# Patient Record
Sex: Female | Born: 1955 | Race: White | Hispanic: No | Smoking: Never smoker
Health system: Southern US, Community
[De-identification: ages and names within clinical notes are randomized; demographics above are authoritative.]

## PROBLEM LIST (undated history)

## (undated) DIAGNOSIS — R7303 Prediabetes: Secondary | ICD-10-CM

## (undated) DIAGNOSIS — E785 Hyperlipidemia, unspecified: Secondary | ICD-10-CM

## (undated) DIAGNOSIS — K219 Gastro-esophageal reflux disease without esophagitis: Secondary | ICD-10-CM

## (undated) DIAGNOSIS — T7840XA Allergy, unspecified, initial encounter: Secondary | ICD-10-CM

## (undated) DIAGNOSIS — K829 Disease of gallbladder, unspecified: Secondary | ICD-10-CM

## (undated) HISTORY — DX: Hyperlipidemia, unspecified: E78.5

## (undated) HISTORY — DX: Gastro-esophageal reflux disease without esophagitis: K21.9

## (undated) HISTORY — PX: ROTATOR CUFF REPAIR: SHX139

## (undated) HISTORY — DX: Disease of gallbladder, unspecified: K82.9

## (undated) HISTORY — DX: Allergy, unspecified, initial encounter: T78.40XA

## (undated) HISTORY — PX: SHOULDER SURGERY: SHX246

## (undated) HISTORY — PX: REDUCTION MAMMAPLASTY: SUR839

## (undated) HISTORY — DX: Prediabetes: R73.03

---

## 1991-12-31 HISTORY — PX: CHOLECYSTECTOMY: SHX55

## 1999-01-03 ENCOUNTER — Other Ambulatory Visit: Admission: RE | Admit: 1999-01-03 | Discharge: 1999-01-03 | Payer: Self-pay | Admitting: Gynecology

## 2000-03-18 ENCOUNTER — Other Ambulatory Visit: Admission: RE | Admit: 2000-03-18 | Discharge: 2000-03-18 | Payer: Self-pay | Admitting: Obstetrics and Gynecology

## 2002-07-27 ENCOUNTER — Encounter (INDEPENDENT_AMBULATORY_CARE_PROVIDER_SITE_OTHER): Payer: Self-pay | Admitting: Specialist

## 2002-07-27 ENCOUNTER — Ambulatory Visit (HOSPITAL_BASED_OUTPATIENT_CLINIC_OR_DEPARTMENT_OTHER): Admission: RE | Admit: 2002-07-27 | Discharge: 2002-07-28 | Payer: Self-pay | Admitting: Plastic Surgery

## 2003-09-13 ENCOUNTER — Other Ambulatory Visit: Admission: RE | Admit: 2003-09-13 | Discharge: 2003-09-13 | Payer: Self-pay | Admitting: Obstetrics and Gynecology

## 2004-12-10 ENCOUNTER — Ambulatory Visit: Payer: Self-pay | Admitting: Internal Medicine

## 2005-01-14 ENCOUNTER — Other Ambulatory Visit: Admission: RE | Admit: 2005-01-14 | Discharge: 2005-01-14 | Payer: Self-pay | Admitting: Obstetrics and Gynecology

## 2005-09-10 ENCOUNTER — Ambulatory Visit: Payer: Self-pay | Admitting: Endocrinology

## 2005-12-09 ENCOUNTER — Ambulatory Visit: Payer: Self-pay | Admitting: Internal Medicine

## 2005-12-12 ENCOUNTER — Ambulatory Visit: Payer: Self-pay | Admitting: *Deleted

## 2006-01-28 ENCOUNTER — Other Ambulatory Visit: Admission: RE | Admit: 2006-01-28 | Discharge: 2006-01-28 | Payer: Self-pay | Admitting: Obstetrics and Gynecology

## 2007-05-29 ENCOUNTER — Ambulatory Visit (HOSPITAL_COMMUNITY): Admission: RE | Admit: 2007-05-29 | Discharge: 2007-05-29 | Payer: Self-pay | Admitting: Obstetrics and Gynecology

## 2007-05-29 ENCOUNTER — Encounter (INDEPENDENT_AMBULATORY_CARE_PROVIDER_SITE_OTHER): Payer: Self-pay | Admitting: Obstetrics and Gynecology

## 2008-05-06 ENCOUNTER — Encounter: Payer: Self-pay | Admitting: Internal Medicine

## 2008-05-16 ENCOUNTER — Encounter: Payer: Self-pay | Admitting: Internal Medicine

## 2008-05-27 ENCOUNTER — Ambulatory Visit: Payer: Self-pay | Admitting: Oncology

## 2008-10-10 ENCOUNTER — Ambulatory Visit: Payer: Self-pay | Admitting: Internal Medicine

## 2008-10-10 DIAGNOSIS — L259 Unspecified contact dermatitis, unspecified cause: Secondary | ICD-10-CM | POA: Insufficient documentation

## 2008-10-10 DIAGNOSIS — J309 Allergic rhinitis, unspecified: Secondary | ICD-10-CM | POA: Insufficient documentation

## 2008-10-18 ENCOUNTER — Telehealth (INDEPENDENT_AMBULATORY_CARE_PROVIDER_SITE_OTHER): Payer: Self-pay | Admitting: *Deleted

## 2011-01-20 ENCOUNTER — Encounter: Payer: Self-pay | Admitting: Obstetrics and Gynecology

## 2011-05-17 NOTE — Op Note (Signed)
NAMEBASILIA, Mia Burnett              ACCOUNT NO.:  [id number]   MEDICAL RECORD NO.:  [id number]          PATIENT TYPE:  AMB   LOCATION:  SDC                           FACILITY:  [facility]   PHYSICIAN:  Juluis Mire, M.D.   DATE OF BIRTH:  1956-01-01   DATE OF PROCEDURE:  05/29/2007  DATE OF DISCHARGE:                               OPERATIVE REPORT   PREOPERATIVE DIAGNOSIS:  Abnormal uterine bleeding.   POSTOPERATIVE DIAGNOSIS:  Abnormal uterine bleeding.   OPERATIVE PROCEDURE:  Paracervical block with hysteroscopy, dilatation  and curettage.   SURGEON:  Juluis Mire, M.D.   ANESTHESIA:  General.   ESTIMATED BLOOD LOSS:  Minimal.   PACKS AND DRAINS:  None.   INTRAOPERATIVE BLOOD REPLACEMENT:  None.   COMPLICATIONS:  None.   INDICATIONS:  As in dictated history and physical.   PROCEDURE:  The patient was taken to the OR and placed in a supine  position.  After satisfactory level of general anesthesia obtained, the  patient was placed in the dorsal position in the [place] stirrups.  The  perineum and vagina were prepped out with Betadine and draped into a  sterile field.  A speculum was placed in the vaginal vault.  Cervix was  grasped with a single-tooth tenaculum.  Paracervical block was  instituted using 1% Nesacaine.  The uterus sounded to approximately 8  cm.  Cervix was serially dilated to a size 35 Pratt dilator.  At this  point in time, the hysteroscope was introduced and the intrauterine  cavity was distended using sorbitol.  Visualization revealed smooth  atrophic endometrium.  Multiple biopsies were obtained along with  curettings.  There were no signs of perforation and no active bleeding.  Total deficit was 60 mL.  At this point in time, the hysteroscope was  removed.  A single-tooth tenaculum and speculum were then also taken  out.  The patient was taken out of the dorsal lithotomy position and  once alert and extubated, transferred to recovery room in good  condition.  Sponge, instrument and needle count was reported as correct  by circulating nurse.      Juluis Mire, M.D.  Electronically Signed    JSM/MEDQ  D:  05/29/2007  T:  05/29/2007  Job:  [id number]

## 2011-05-17 NOTE — H&P (Signed)
NAMEANGELYN, Mia Burnett              ACCOUNT NO.:  [id number]   MEDICAL RECORD NO.:  [id number]          PATIENT TYPE:  AMB   LOCATION:  SDC                           FACILITY:  [facility]   PHYSICIAN:  Juluis Mire, M.D.   DATE OF BIRTH:  Jun 06, 1956   DATE OF ADMISSION:  05/29/2007  DATE OF DISCHARGE:                              HISTORY & PHYSICAL   HISTORY:  The patient is a 55 year old gravida 1, para 1 female presents  for hysteroscopy.   The patient's cycle became progressively irregular.  They are heavy for  7 to 9 days.  She has some increasing right lower quadrant pain.  She  had a previous saline infusion ultrasound that was negative but because  of stenotic cervix we were unable to do an endometrial sampling.  Because of continued irregularity we are going to proceed for  hysteroscopic evaluation.  We did do a follow-up ultrasound.  Both  ovaries appeared to be normal.   ALLERGIES:  In terms of allergies she is allergic to SULFA.   MEDICATIONS:  Mobic.   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   PAST SURGICAL HISTORY:  She had a cesarean section and her gallbladder  removed.   OBSTETRICAL HISTORY:  She had one cesarean section.   FAMILY HISTORY:  Noncontributory.   SOCIAL HISTORY:  No tobacco or alcohol use.   REVIEW OF SYSTEMS:  Noncontributory.   PHYSICAL EXAM:  VITAL SIGNS:  The patient is afebrile, stable vital  signs.  HEENT: The patient is normocephalic.  Pupils equal and react to light  accommodation.  Extraocular were intact.  Sclerae and conjunctivae are  clear.  Oropharynx clear.  NECK:  Without thyromegaly.  BREASTS:  Not examined.  LUNGS:  Clear.  CARDIAC SYSTEM:  Regular rate.  No murmurs or gallops.  ABDOMEN:  Benign.  No mass, organomegaly or tenderness.  PELVIC:  Normal external genitalia.  Vaginal mucosa is clear.  Cervix  unremarkable.  Uterus normal size, shape and contour.  Adnexa free of  mass or tenderness.  EXTREMITIES:  Trace edema.  NEUROLOGIC:  Grossly within normal limits.   IMPRESSION:  Continued abnormal bleeding with stenotic cervix.   PLAN:  The patient will ago hysteroscopic evaluation to rule out  endometrial hyperplasia.  The risks of surgery have been discussed  including the risk of infection.  The risk of vascular injury could lead  to hemorrhage requiring  transfusion or possible hysterectomy.  The risk of injury to adjacent  organs through perforation that could require exploratory surgery.  Risk  of deep venous thrombosis and pulmonary embolus.  The patient expressed  understanding of indications and risks.      Juluis Mire, M.D.  Electronically Signed     JSM/MEDQ  D:  05/29/2007  T:  05/29/2007  Job:  [id number]

## 2012-06-29 ENCOUNTER — Other Ambulatory Visit: Payer: Self-pay | Admitting: Obstetrics and Gynecology

## 2012-06-29 DIAGNOSIS — Z803 Family history of malignant neoplasm of breast: Secondary | ICD-10-CM

## 2012-07-22 ENCOUNTER — Other Ambulatory Visit: Payer: Self-pay

## 2012-09-14 ENCOUNTER — Other Ambulatory Visit: Payer: Self-pay

## 2014-08-26 ENCOUNTER — Ambulatory Visit (HOSPITAL_BASED_OUTPATIENT_CLINIC_OR_DEPARTMENT_OTHER): Payer: BC Managed Care – PPO | Admitting: Genetic Counselor

## 2014-08-26 ENCOUNTER — Other Ambulatory Visit: Payer: BC Managed Care – PPO

## 2014-08-26 DIAGNOSIS — IMO0002 Reserved for concepts with insufficient information to code with codable children: Secondary | ICD-10-CM

## 2014-08-26 DIAGNOSIS — Z803 Family history of malignant neoplasm of breast: Secondary | ICD-10-CM | POA: Insufficient documentation

## 2014-08-26 NOTE — Progress Notes (Signed)
HISTORY OF PRESENT ILLNESS: Dr. Jenny Reichmann requested a cancer genetics consultation for Mia Burnett, a 58 y.o. female, due to a family history of breast cancer.  Mia Burnett presents to clinic today to discuss the possibility of a hereditary predisposition to cancer, genetic testing, and to further clarify her future cancer risks, as well as potential cancer risk for family members. Mia Burnett has no personal history of cancer. She was previously seen in cancer genetics in 2009 and at that time had negative BRCA1/2 testing at [facility]. This test did not include rearrangement analysis testing and thus she has not had the most up-to-date genetic test.   History   Social History   Marital Status: Married     FAMILY HISTORY:  During the visit, a 4-generation pedigree was obtained. Significant diagnoses include the following:  Family History  Problem Relation Age of Onset   Cancer Mother 48    breast   Cancer Sister     3 maternal half sisters with breast cancer, one at age 29, one at age52, one at age 1    Mia Burnett ancestry is of Caucasian descent. There is no known Jewish ancestry or consanguinity.  GENETIC COUNSELING ASSESSMENT: Mia Burnett is a 58 y.o. female with a family history of breast cancer suggestive of a hereditary predisposition to cancer. We, therefore, discussed and recommended the following at today's visit.   DISCUSSION: We reviewed the characteristics, features and inheritance patterns of hereditary cancer syndromes. We also discussed genetic testing, including the appropriate family members to test, the process of testing, insurance coverage and turn-around-time for results. We discussed the implications of a negative, positive and/or variant of uncertain significant result. We recommended Mia Burnett pursue genetic testing for the BreastNext gene panel.   PLAN: Based on our above recommendation, Mia Burnett wished to pursue genetic testing and the blood sample  was drawn and will be sent to [facility] for analysis. Results should be available within approximately 5 weeks time, at which point they will be disclosed by telephone to Mia Burnett, as will any additional recommendations warranted by these results.   Based on Mia Burnett family history, we also recommended her maternal half-sister, who was diagnosed with breast cancer at age 43, have genetic counseling and testing. We discussed that it is always most informative to initiate genetic testing in a family member diagnosed with cancer. Mia Burnett will speak with this family member and let us know if we can be of any assistance in coordinating genetic counseling and/or testing.   We also encouraged Mia Burnett to remain in contact with cancer genetics annually so that we can continuously update the family history and inform her of any changes in cancer genetics and testing that may be of benefit for this family. [name] questions were answered to her satisfaction today.  Our contact information was provided should additional questions or concerns arise.   Thank you for the referral and allowing [place] to share in the care of your patient.   The patient was seen for a total of 40 minutes in face-to-face genetic counseling.  This patient was discussed with Magrinat who agrees with the above.    _______________________________________________________________________ For Office Staff:  Number of people involved in session: 2 Was an Intern/ student involved with case: not applicable

## 2014-09-27 ENCOUNTER — Encounter: Payer: Self-pay | Admitting: Genetic Counselor

## 2014-09-27 DIAGNOSIS — Z803 Family history of malignant neoplasm of breast: Secondary | ICD-10-CM

## 2014-09-27 NOTE — Progress Notes (Signed)
HPI:  Ms. Odonnel was previously seen in the [facility] due to a family history of cancer and concerns regarding a hereditary predisposition to cancer. Please refer to our prior cancer genetics clinic note for more information regarding Ms. Boxx's medical, social and family histories, and our assessment and recommendations, at the time. Ms. Gaultney recent genetic test results were disclosed to her, as were recommendations warranted by these results. These results and recommendations are discussed in more detail below.  GENETIC TEST RESULTS: At the time of Ms. Zipper's visit, we recommended she pursue genetic testing of the BreastNext gene panel. This test, which included sequencing and deletion/duplication analysis of the genes, was performed at [facility]. Genetic testing was normal, and did not reveal a deleterious mutation in these genes. A complete list of all genes tested is located on the test report scanned into [facility].    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 At this time, it is unknown if this variant is associated with an increased risk for cancer or if this is a normal finding. With time, we suspect the lab will reclassify this variant and when they do, we will try to re-contact Ms. Corliss to discuss the reclassification further.   We discussed with Ms. Bias that since the current genetic testing is not perfect, it is possible there may be a gene mutation in one of these genes that current testing cannot detect, but that chance is small.  We also discussed, that it is possible that another gene that has not yet been discovered, or that we have not yet tested, is responsible for the cancer diagnoses in the family, and it is, therefore, important to remain in touch with cancer genetics in the future so that we can continue to offer Ms. Aschenbrenner the most up to date genetic testing.   CANCER  SCREENING RECOMMENDATIONS: This result is generally reassuring and indicates that Ms. Lanigan does not likely have an increased risk of cancer due to a a mutation in one of these genes.  We, therefore, recommended  Ms. Moll continue to follow the cancer screening guidelines provided by her primary healthcare providers.   RECOMMENDATIONS FOR FAMILY MEMBERS:  While these results are reassuring for Ms. Bacorn, this test does not tell us anything about Ms. Dikes's maternal relatives' risks, including her maternal half sisters with breast cancer. We recommended these relatives also have genetic counseling and testing. Further genetic testing in Ms. Mould's family might help [place] be even more confident in Ms. Considine's own results, as genetic testing is best initiated in someone who has had cancer. Please let us know if we can help facilitate testing. Genetic counselors can be located in other cities, by visiting the website of the [employer] ([employer]ist[employer].se) and Field seismologist for a Dietitian by zip code.  FOLLOW-UP: Lastly, we discussed with Ms. Shumard that cancer genetics is a rapidly advancing field and it is possible that new genetic tests will be appropriate for her and/or her family members in the future. We encouraged her to remain in contact with cancer genetics on an annual basis so we can update her personal and family histories and let her know of advances in cancer genetics that may benefit this family.   Our contact number was provided. Ms. Traore questions were answered to her satisfaction, and she knows she is welcome to call us at anytime with additional questions or concerns.   Barnetta Chapel  AMarya Landry, MS, CGC Certified Genetic Counseor [email or web address]

## 2014-10-06 ENCOUNTER — Other Ambulatory Visit: Payer: Self-pay | Admitting: Obstetrics and Gynecology

## 2014-10-06 DIAGNOSIS — Z9189 Other specified personal risk factors, not elsewhere classified: Secondary | ICD-10-CM

## 2014-10-06 DIAGNOSIS — Z803 Family history of malignant neoplasm of breast: Secondary | ICD-10-CM

## 2014-11-09 ENCOUNTER — Ambulatory Visit
Admission: RE | Admit: 2014-11-09 | Discharge: 2014-11-09 | Disposition: A | Discharge status: Home / Self Care | Payer: BC Managed Care – PPO | Source: Ambulatory Visit | Attending: Obstetrics and Gynecology | Admitting: Obstetrics and Gynecology

## 2014-11-09 DIAGNOSIS — Z9189 Other specified personal risk factors, not elsewhere classified: Secondary | ICD-10-CM

## 2014-11-09 DIAGNOSIS — Z803 Family history of malignant neoplasm of breast: Secondary | ICD-10-CM

## 2014-11-09 MED ORDER — GADOBENATE DIMEGLUMINE 529 MG/ML IV SOLN
19.0000 mL | Freq: Once | INTRAVENOUS | Status: AC | PRN
  Administered 2014-11-09: 19 mL via INTRAVENOUS

## 2014-11-15 ENCOUNTER — Other Ambulatory Visit (HOSPITAL_COMMUNITY): Payer: Self-pay | Admitting: Obstetrics and Gynecology

## 2014-11-15 DIAGNOSIS — K7689 Other specified diseases of liver: Secondary | ICD-10-CM

## 2014-11-28 ENCOUNTER — Ambulatory Visit (HOSPITAL_COMMUNITY)
Admission: RE | Admit: 2014-11-28 | Discharge: 2014-11-28 | Disposition: A | Discharge status: Home / Self Care | Payer: BC Managed Care – PPO | Source: Ambulatory Visit | Attending: Obstetrics and Gynecology | Admitting: Obstetrics and Gynecology

## 2014-11-28 DIAGNOSIS — K7689 Other specified diseases of liver: Secondary | ICD-10-CM

## 2014-11-28 DIAGNOSIS — Z9049 Acquired absence of other specified parts of digestive tract: Secondary | ICD-10-CM | POA: Insufficient documentation

## 2014-11-28 DIAGNOSIS — K769 Liver disease, unspecified: Secondary | ICD-10-CM | POA: Diagnosis not present

## 2015-01-24 ENCOUNTER — Ambulatory Visit (INDEPENDENT_AMBULATORY_CARE_PROVIDER_SITE_OTHER): Payer: BLUE CROSS/BLUE SHIELD | Admitting: Podiatry

## 2015-01-24 ENCOUNTER — Ambulatory Visit (INDEPENDENT_AMBULATORY_CARE_PROVIDER_SITE_OTHER): Payer: BLUE CROSS/BLUE SHIELD

## 2015-01-24 DIAGNOSIS — M722 Plantar fascial fibromatosis: Secondary | ICD-10-CM

## 2015-01-24 MED ORDER — MELOXICAM 15 MG PO TABS: 15.0000 mg | ORAL_TABLET | Freq: Every day | ORAL | Status: DC

## 2015-01-24 MED ORDER — METHYLPREDNISOLONE (PAK) 4 MG PO TABS: ORAL_TABLET | ORAL | Status: DC

## 2015-01-24 NOTE — Progress Notes (Signed)
   Subjective:    Patient ID: Mia DiversDebra R Hollingshed, female    DOB: 02/05/1956, 59 y.o.   MRN: [id number]  HPI Comments: "I have pain in the heel"  Patient c/o aching, throbbing plantar/medial heel left since October. She has AM pain. Pain is getting constant. Tried stretching and ice-no help.  Foot Pain      Review of Systems  Allergic/Immunologic: Positive for environmental allergies.  All other systems reviewed and are negative.      Objective:   Physical Exam: I have reviewed her past history medications allergies surgery social history and review of systems. Pulses are strongly palpable. Neurologic sensorium is intact percent as well as C monofilament. Deep tendon reflexes are intact bilateral and muscle strength is 5 over 5 dorsiflexion plantar flexors and inverters and everters all into the musculature is intact. Orthopedic evaluation to straight all joints distal to the ankle for range of motion without crepitation. She has pain on palpation medial calcaneal tubercle of the left heel. Radiographs confirms plantar distally oriented her care heel spur of the soft tissue increase in density at the plantar fascial insertion site.        Assessment & Plan:  Assessment: Plantar fasciitis left.  Plan: We discussed the etiology pathology conservative versus surgical therapies. We discussed appropriate shoe gear stretching exercises ice therapy issues or modifications. I injected the left heel today and put her plantar fascial wrapping left. Also put her in a plantar fascial night splint. We discussed tennis shoes. I injected her left heel with dexamethasone Kenalog and local anesthetic. I wrote a prescription for Medrol Dosepak to be followed by meloxicam. I will follow-up with her in 1 month.

## 2015-01-24 NOTE — Patient Instructions (Signed)

## 2015-02-02 ENCOUNTER — Ambulatory Visit: Payer: Self-pay | Admitting: Podiatry

## 2015-02-21 ENCOUNTER — Ambulatory Visit: Payer: BLUE CROSS/BLUE SHIELD | Admitting: Podiatry

## 2015-02-28 ENCOUNTER — Ambulatory Visit (INDEPENDENT_AMBULATORY_CARE_PROVIDER_SITE_OTHER): Payer: BLUE CROSS/BLUE SHIELD | Admitting: Podiatry

## 2015-02-28 ENCOUNTER — Encounter: Payer: Self-pay | Admitting: Podiatry

## 2015-02-28 VITALS — BP 128/66 | HR 72 | Resp 16

## 2015-02-28 DIAGNOSIS — M722 Plantar fascial fibromatosis: Secondary | ICD-10-CM

## 2015-03-01 NOTE — Progress Notes (Signed)
She presents today for follow-up of her plantar fasciitis. She states that for the most part is doing very well while she wears her plantar fascial brace. She states that without the brace the foot is still sore and by evening it is tender. She states that she is unable to wear the brace to bed and she refers to the night splint. However she wears night splints while sitting and reading or watching television. She continues her medications.  Objective: Vital signs are stable she is alert and oriented 3. Pulses are strongly palpable. She is pain on palpation medially located tubercle of the left heel. No pain on medial lateral compression of the calcaneus pulses remain palpable and no calf pain.  Assessment: Residual plantar fasciitis left.  Plan: Reinjected the left heel today and suggested that she continue all conservative therapies. Follow up with her in 1 month if needed.

## 2015-03-28 ENCOUNTER — Ambulatory Visit: Payer: BLUE CROSS/BLUE SHIELD | Admitting: *Deleted

## 2015-03-28 DIAGNOSIS — M722 Plantar fascial fibromatosis: Secondary | ICD-10-CM

## 2015-03-28 NOTE — Progress Notes (Signed)
Patient ID: Mia DiversDebra R Burnett, female   DOB: 05/04/1956, 59 y.o.   MRN: [id number] PICKING UP INSERTS

## 2015-03-28 NOTE — Patient Instructions (Signed)

## 2015-04-06 ENCOUNTER — Ambulatory Visit: Payer: BLUE CROSS/BLUE SHIELD | Admitting: Podiatry

## 2015-05-18 ENCOUNTER — Other Ambulatory Visit: Payer: Self-pay | Admitting: Podiatry

## 2015-06-15 ENCOUNTER — Telehealth: Payer: Self-pay | Admitting: *Deleted

## 2015-06-15 MED ORDER — MELOXICAM 15 MG PO TABS
15.0000 mg | ORAL_TABLET | Freq: Every day | ORAL | Status: DC
Start: 2015-06-15 — End: 2016-01-29

## 2015-06-15 NOTE — Telephone Encounter (Signed)
[facility] request to change pt's Meloxicam 15mg  to 90 days supply.  Dr. Al Corpus states okay to write as 90 day supply with 1 refill.  Faxed to [phone].

## 2015-07-04 ENCOUNTER — Ambulatory Visit (INDEPENDENT_AMBULATORY_CARE_PROVIDER_SITE_OTHER): Payer: BLUE CROSS/BLUE SHIELD

## 2015-07-04 ENCOUNTER — Ambulatory Visit (INDEPENDENT_AMBULATORY_CARE_PROVIDER_SITE_OTHER): Payer: BLUE CROSS/BLUE SHIELD | Admitting: Podiatry

## 2015-07-04 ENCOUNTER — Encounter: Payer: Self-pay | Admitting: Podiatry

## 2015-07-04 VITALS — BP 134/77 | HR 66 | Resp 16

## 2015-07-04 DIAGNOSIS — M779 Enthesopathy, unspecified: Secondary | ICD-10-CM

## 2015-07-05 NOTE — Progress Notes (Signed)
She presents today for a chief complaint of painful dorsal aspect of her left foot. She states that she has had plantar fascial symptoms for the past few years. She states however this is much more painful and she points to the dorsal aspect of the left foot. She denies trauma she denies changes in her past medical history medications allergies. She has tried meloxicam to know about.  Objective: Vital signs are stable she is alert and oriented 3 pulses are palpable pain. She has pain on end range of motion of the third metatarsophalangeal joint with some radiating pain mid diaphyseal region of the third metatarsal. Radiographs reviewed and do not demonstrate any type of osseus abnormalities in this area nothing consistent with a fracture.  Assessment: Capsulitis third metatarsophalangeal joint left foot.  Plan: Injected periarticular Nedra HaiLee today to the third metatarsal phalangeal joint area of the left foot. She will continue to take her meloxicam a regular basis.

## 2015-07-06 ENCOUNTER — Ambulatory Visit: Payer: BLUE CROSS/BLUE SHIELD | Admitting: Podiatry

## 2015-08-29 ENCOUNTER — Other Ambulatory Visit: Payer: Self-pay | Admitting: Obstetrics and Gynecology

## 2015-08-30 LAB — CYTOLOGY - PAP

## 2015-09-05 ENCOUNTER — Other Ambulatory Visit: Payer: Self-pay | Admitting: Obstetrics and Gynecology

## 2015-09-05 DIAGNOSIS — Z803 Family history of malignant neoplasm of breast: Secondary | ICD-10-CM

## 2016-01-29 ENCOUNTER — Encounter: Payer: Self-pay | Admitting: Podiatry

## 2016-01-29 ENCOUNTER — Ambulatory Visit (INDEPENDENT_AMBULATORY_CARE_PROVIDER_SITE_OTHER): Payer: BLUE CROSS/BLUE SHIELD

## 2016-01-29 ENCOUNTER — Ambulatory Visit (INDEPENDENT_AMBULATORY_CARE_PROVIDER_SITE_OTHER): Payer: BLUE CROSS/BLUE SHIELD | Admitting: Podiatry

## 2016-01-29 VITALS — BP 127/65 | HR 75 | Resp 18

## 2016-01-29 DIAGNOSIS — R52 Pain, unspecified: Secondary | ICD-10-CM | POA: Diagnosis not present

## 2016-01-29 DIAGNOSIS — M722 Plantar fascial fibromatosis: Secondary | ICD-10-CM | POA: Diagnosis not present

## 2016-01-29 MED ORDER — MELOXICAM 15 MG PO TABS: 15.0000 mg | ORAL_TABLET | Freq: Every day | ORAL | Status: DC

## 2016-01-29 NOTE — Progress Notes (Signed)
Patient ID: RUMI KOLODZIEJ, female   DOB: [birth year], 60 y.o.   MRN: [id number]  Subjective: 60 year old female presents the office as concerns of bilateral heel pain which has been ongoing since the weekend. She states that she has pain in the morning when she first gets up. She denies any swelling or redness. No tingling or numbness. No recent injury or trauma. The pain does not wake her up in night. She states she had plantar fasciitis and left side as well as the right side she states it feels the same. She has been wearing orthotics. Denies any systemic complaints such as fevers, chills, nausea, vomiting. No acute changes since last appointment, and no other complaints at this time.   Objective: AAO x3, NAD DP/PT pulses palpable bilaterally, CRT less than 3 seconds Protective sensation intact with Simms Weinstein monofilament Tenderness to palpation along the plantar medial tubercle of the calcaneus at the insertion of plantar fascia on the left and right foot. There is no pain along the course of the plantar fascia within the arch of the foot. Plantar fascia appears to be intact. There is no pain with lateral compression of the calcaneus or pain with vibratory sensation. There is no pain along the course or insertion of the achilles tendon. No other areas of tenderness to bilateral lower extremities. No areas of pinpoint bony tenderness or pain with vibratory sensation. MMT 5/5, ROM WNL. No edema, erythema, increase in warmth to bilateral lower extremities.  No open lesions or pre-ulcerative lesions.  No pain with calf compression, swelling, warmth, erythema  Assessment: Bilateral plantar fasciitis  Plan: -All treatment options discussed with the patient including all alternatives, risks, complications.  -X-rays were obtained and reviewed with the patient. No definitive evidence of acute fracture or stress fracture. -Treatment options discussed including all alternatives, risks, and  complications -Etiology of symptoms were discussed -Patient elects to proceed with steroid injection into the bilateral heels. Under sterile skin preparation, a total of 2.5cc of kenalog 10, 0.5% Marcaine plain, and 2% lidocaine plain were infiltrated into the symptomatic area without complication. A band-aid was applied. Patient tolerated the injection well without complication. Post-injection care with discussed with the patient. Discussed with the patient to ice the area over the next couple of days to help prevent a steroid flare.  -Prescribed mobic. Discussed side effects of the medication and directed to stop if any are to occur and call the office.  -Stretching icing daily. Continue orthotics. -She was also into with plantar fascial brace. She has a lipoma dispensed another. -Follow-up in 3 weeks or sooner if any problems arise. In the meantime, encouraged to call the office with any questions, concerns, change in symptoms.   Ovid Curd, DPM

## 2016-02-20 ENCOUNTER — Ambulatory Visit (INDEPENDENT_AMBULATORY_CARE_PROVIDER_SITE_OTHER): Payer: BLUE CROSS/BLUE SHIELD | Admitting: Podiatry

## 2016-02-20 ENCOUNTER — Encounter: Payer: Self-pay | Admitting: Podiatry

## 2016-02-20 VITALS — BP 111/52 | HR 75 | Resp 16

## 2016-02-20 DIAGNOSIS — M722 Plantar fascial fibromatosis: Secondary | ICD-10-CM | POA: Diagnosis not present

## 2016-02-20 MED ORDER — METHYLPREDNISOLONE 4 MG PO TBPK: ORAL_TABLET | ORAL | Status: DC

## 2016-02-20 MED ORDER — MELOXICAM 15 MG PO TABS: 15.0000 mg | ORAL_TABLET | Freq: Every day | ORAL | Status: DC

## 2016-02-20 NOTE — Progress Notes (Signed)
She presents today for a follow-up of her bilateral plantar fasciitis. She states that her left foot is doing very well and her right foot remains tender. She states that she regularly wears her plantar fascial brace however she did not wear it today. She continues to use of the night splint to the right foot. She continues to take her meloxicam regularly. She states the last time she saw Dr. Ardelle Anton he did not prescribe steroids.  Objective: Vital signs are stable she is alert and oriented 3 pulses are palpable. Neurologic sensorium is intact. She is no pain on palpation medial calcaneal tubercle of the left heel. She does however have pain on palpation medial calcaneal tubercle of the right heel.  Assessment: Plantar fasciitis resolving left painful plantar fasciitis right foot.  Plan: I reinjected her right heel today with Kenalog and local anesthetic. Encouraged her to wear her plantar fascial brace and night splint. Discussed appropriate shoe gear stress changes eyes and ice therapy. I also encouraged her to start a prednisone pack for 6 days which I prescribed for her today. She will discontinue the meloxicam during that time. She will restart the meloxicam after she is done with the steroid. I will follow up with her in 1 month.

## 2016-03-19 ENCOUNTER — Ambulatory Visit: Payer: BLUE CROSS/BLUE SHIELD | Admitting: Podiatry

## 2016-03-21 ENCOUNTER — Ambulatory Visit: Payer: BLUE CROSS/BLUE SHIELD | Admitting: Podiatry

## 2016-06-02 ENCOUNTER — Other Ambulatory Visit: Payer: Self-pay | Admitting: Podiatry

## 2016-06-21 ENCOUNTER — Encounter: Payer: Self-pay | Admitting: Genetic Counselor

## 2016-06-28 ENCOUNTER — Encounter: Payer: Self-pay | Admitting: Genetic Counselor

## 2016-07-18 DIAGNOSIS — Z1231 Encounter for screening mammogram for malignant neoplasm of breast: Secondary | ICD-10-CM | POA: Diagnosis not present

## 2016-07-18 DIAGNOSIS — Z803 Family history of malignant neoplasm of breast: Secondary | ICD-10-CM | POA: Diagnosis not present

## 2016-07-23 ENCOUNTER — Other Ambulatory Visit: Payer: Self-pay

## 2016-07-29 ENCOUNTER — Ambulatory Visit
Admission: RE | Admit: 2016-07-29 | Discharge: 2016-07-29 | Disposition: A | Discharge status: Home / Self Care | Payer: BLUE CROSS/BLUE SHIELD | Source: Ambulatory Visit | Attending: Obstetrics and Gynecology | Admitting: Obstetrics and Gynecology

## 2016-07-29 DIAGNOSIS — Z803 Family history of malignant neoplasm of breast: Secondary | ICD-10-CM

## 2016-07-29 MED ORDER — GADOBENATE DIMEGLUMINE 529 MG/ML IV SOLN
20.0000 mL | Freq: Once | INTRAVENOUS | Status: AC | PRN
  Administered 2016-07-29: 20 mL via INTRAVENOUS

## 2016-08-02 ENCOUNTER — Telehealth: Payer: Self-pay | Admitting: Genetic Counselor

## 2016-08-02 ENCOUNTER — Encounter: Payer: Self-pay | Admitting: Genetic Counselor

## 2016-08-02 DIAGNOSIS — Z1379 Encounter for other screening for genetic and chromosomal anomalies: Secondary | ICD-10-CM | POA: Insufficient documentation

## 2016-08-02 NOTE — Telephone Encounter (Signed)
Patient recv'd a recall letter from [place] about updating her genetic testing.  When reviewing the chart I realized she had updated testing in 2015.  There is nothing new since 2015 (she had original testing in 2010).  Based on this new information she felt comfortable cancelling Monday's appointment.  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

## 2016-08-02 NOTE — Telephone Encounter (Signed)
LM on VM about appointment.  It appears that she recv'd a letter from [place] asking her to schedule an appointment for updated testing.  In reviewing her chart it appears that she had updated testing in 2015 and that the letter was sent in error.  Asked that she CB to discuss further.  Left CB instructions.

## 2016-08-05 ENCOUNTER — Encounter: Payer: Self-pay | Admitting: Genetic Counselor

## 2016-08-05 ENCOUNTER — Other Ambulatory Visit: Payer: Self-pay

## 2016-12-26 DIAGNOSIS — J3089 Other allergic rhinitis: Secondary | ICD-10-CM | POA: Diagnosis not present

## 2017-01-22 DIAGNOSIS — J4520 Mild intermittent asthma, uncomplicated: Secondary | ICD-10-CM | POA: Diagnosis not present

## 2017-01-22 DIAGNOSIS — J3081 Allergic rhinitis due to animal (cat) (dog) hair and dander: Secondary | ICD-10-CM | POA: Diagnosis not present

## 2017-01-22 DIAGNOSIS — J3089 Other allergic rhinitis: Secondary | ICD-10-CM | POA: Diagnosis not present

## 2017-01-22 DIAGNOSIS — J301 Allergic rhinitis due to pollen: Secondary | ICD-10-CM | POA: Diagnosis not present

## 2017-02-12 DIAGNOSIS — Z20828 Contact with and (suspected) exposure to other viral communicable diseases: Secondary | ICD-10-CM | POA: Diagnosis not present

## 2017-02-12 DIAGNOSIS — R6889 Other general symptoms and signs: Secondary | ICD-10-CM | POA: Diagnosis not present

## 2017-04-07 DIAGNOSIS — L438 Other lichen planus: Secondary | ICD-10-CM | POA: Diagnosis not present

## 2017-04-07 DIAGNOSIS — L814 Other melanin hyperpigmentation: Secondary | ICD-10-CM | POA: Diagnosis not present

## 2017-04-07 DIAGNOSIS — L821 Other seborrheic keratosis: Secondary | ICD-10-CM | POA: Diagnosis not present

## 2017-07-22 DIAGNOSIS — Z1231 Encounter for screening mammogram for malignant neoplasm of breast: Secondary | ICD-10-CM | POA: Diagnosis not present

## 2017-07-22 DIAGNOSIS — Z803 Family history of malignant neoplasm of breast: Secondary | ICD-10-CM | POA: Diagnosis not present

## 2017-10-14 DIAGNOSIS — Z6836 Body mass index (BMI) 36.0-36.9, adult: Secondary | ICD-10-CM | POA: Diagnosis not present

## 2017-10-14 DIAGNOSIS — Z01419 Encounter for gynecological examination (general) (routine) without abnormal findings: Secondary | ICD-10-CM | POA: Diagnosis not present

## 2017-10-14 DIAGNOSIS — Z13820 Encounter for screening for osteoporosis: Secondary | ICD-10-CM | POA: Diagnosis not present

## 2017-10-27 DIAGNOSIS — J3089 Other allergic rhinitis: Secondary | ICD-10-CM | POA: Diagnosis not present

## 2017-12-03 DIAGNOSIS — R42 Dizziness and giddiness: Secondary | ICD-10-CM | POA: Diagnosis not present

## 2018-04-29 DIAGNOSIS — J3081 Allergic rhinitis due to animal (cat) (dog) hair and dander: Secondary | ICD-10-CM | POA: Diagnosis not present

## 2018-04-29 DIAGNOSIS — J4520 Mild intermittent asthma, uncomplicated: Secondary | ICD-10-CM | POA: Diagnosis not present

## 2018-04-29 DIAGNOSIS — J301 Allergic rhinitis due to pollen: Secondary | ICD-10-CM | POA: Diagnosis not present

## 2018-04-29 DIAGNOSIS — J3089 Other allergic rhinitis: Secondary | ICD-10-CM | POA: Diagnosis not present

## 2018-07-23 DIAGNOSIS — Z1231 Encounter for screening mammogram for malignant neoplasm of breast: Secondary | ICD-10-CM | POA: Diagnosis not present

## 2018-07-23 DIAGNOSIS — Z803 Family history of malignant neoplasm of breast: Secondary | ICD-10-CM | POA: Diagnosis not present

## 2018-10-09 DIAGNOSIS — M7062 Trochanteric bursitis, left hip: Secondary | ICD-10-CM | POA: Diagnosis not present

## 2018-10-09 DIAGNOSIS — M25552 Pain in left hip: Secondary | ICD-10-CM | POA: Diagnosis not present

## 2018-10-09 DIAGNOSIS — M25559 Pain in unspecified hip: Secondary | ICD-10-CM | POA: Insufficient documentation

## 2018-10-15 DIAGNOSIS — Z01419 Encounter for gynecological examination (general) (routine) without abnormal findings: Secondary | ICD-10-CM | POA: Diagnosis not present

## 2018-10-15 DIAGNOSIS — Z6837 Body mass index (BMI) 37.0-37.9, adult: Secondary | ICD-10-CM | POA: Diagnosis not present

## 2018-10-22 DIAGNOSIS — R011 Cardiac murmur, unspecified: Secondary | ICD-10-CM | POA: Diagnosis not present

## 2018-10-22 DIAGNOSIS — E785 Hyperlipidemia, unspecified: Secondary | ICD-10-CM | POA: Diagnosis not present

## 2018-10-22 DIAGNOSIS — Z1329 Encounter for screening for other suspected endocrine disorder: Secondary | ICD-10-CM | POA: Diagnosis not present

## 2018-10-22 DIAGNOSIS — Z13228 Encounter for screening for other metabolic disorders: Secondary | ICD-10-CM | POA: Diagnosis not present

## 2018-10-22 DIAGNOSIS — Z130 Encounter for screening for diseases of the blood and blood-forming organs and certain disorders involving the immune mechanism: Secondary | ICD-10-CM | POA: Diagnosis not present

## 2018-10-23 ENCOUNTER — Encounter: Payer: Self-pay | Admitting: Licensed Clinical Social Worker

## 2018-10-23 NOTE — Progress Notes (Signed)
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

## 2018-11-02 DIAGNOSIS — J3089 Other allergic rhinitis: Secondary | ICD-10-CM | POA: Diagnosis not present

## 2018-11-11 DIAGNOSIS — R799 Abnormal finding of blood chemistry, unspecified: Secondary | ICD-10-CM | POA: Diagnosis not present

## 2018-11-23 DIAGNOSIS — R944 Abnormal results of kidney function studies: Secondary | ICD-10-CM | POA: Diagnosis not present

## 2019-01-28 DIAGNOSIS — R944 Abnormal results of kidney function studies: Secondary | ICD-10-CM | POA: Diagnosis not present

## 2019-04-08 DIAGNOSIS — J3089 Other allergic rhinitis: Secondary | ICD-10-CM | POA: Diagnosis not present

## 2019-04-22 DIAGNOSIS — J3089 Other allergic rhinitis: Secondary | ICD-10-CM | POA: Diagnosis not present

## 2019-05-06 DIAGNOSIS — J3089 Other allergic rhinitis: Secondary | ICD-10-CM | POA: Diagnosis not present

## 2019-05-27 DIAGNOSIS — J3081 Allergic rhinitis due to animal (cat) (dog) hair and dander: Secondary | ICD-10-CM | POA: Diagnosis not present

## 2019-05-27 DIAGNOSIS — J4520 Mild intermittent asthma, uncomplicated: Secondary | ICD-10-CM | POA: Diagnosis not present

## 2019-05-27 DIAGNOSIS — J3089 Other allergic rhinitis: Secondary | ICD-10-CM | POA: Diagnosis not present

## 2019-05-27 DIAGNOSIS — J301 Allergic rhinitis due to pollen: Secondary | ICD-10-CM | POA: Diagnosis not present

## 2019-06-03 DIAGNOSIS — Z713 Dietary counseling and surveillance: Secondary | ICD-10-CM | POA: Diagnosis not present

## 2019-06-11 DIAGNOSIS — J3089 Other allergic rhinitis: Secondary | ICD-10-CM | POA: Diagnosis not present

## 2019-06-24 DIAGNOSIS — J3089 Other allergic rhinitis: Secondary | ICD-10-CM | POA: Diagnosis not present

## 2019-06-24 DIAGNOSIS — H00022 Hordeolum internum right lower eyelid: Secondary | ICD-10-CM | POA: Diagnosis not present

## 2019-07-08 DIAGNOSIS — J3089 Other allergic rhinitis: Secondary | ICD-10-CM | POA: Diagnosis not present

## 2019-07-22 DIAGNOSIS — J3089 Other allergic rhinitis: Secondary | ICD-10-CM | POA: Diagnosis not present

## 2019-07-26 DIAGNOSIS — Z1231 Encounter for screening mammogram for malignant neoplasm of breast: Secondary | ICD-10-CM | POA: Diagnosis not present

## 2019-07-26 DIAGNOSIS — M7062 Trochanteric bursitis, left hip: Secondary | ICD-10-CM | POA: Insufficient documentation

## 2019-07-26 DIAGNOSIS — Z803 Family history of malignant neoplasm of breast: Secondary | ICD-10-CM | POA: Diagnosis not present

## 2019-08-03 DIAGNOSIS — J3089 Other allergic rhinitis: Secondary | ICD-10-CM | POA: Diagnosis not present

## 2019-08-19 DIAGNOSIS — J3089 Other allergic rhinitis: Secondary | ICD-10-CM | POA: Diagnosis not present

## 2019-08-24 DIAGNOSIS — J3089 Other allergic rhinitis: Secondary | ICD-10-CM | POA: Diagnosis not present

## 2019-09-02 DIAGNOSIS — J3089 Other allergic rhinitis: Secondary | ICD-10-CM | POA: Diagnosis not present

## 2019-09-17 DIAGNOSIS — J3089 Other allergic rhinitis: Secondary | ICD-10-CM | POA: Diagnosis not present

## 2019-10-12 DIAGNOSIS — J3089 Other allergic rhinitis: Secondary | ICD-10-CM | POA: Diagnosis not present

## 2019-10-18 DIAGNOSIS — Z6839 Body mass index (BMI) 39.0-39.9, adult: Secondary | ICD-10-CM | POA: Diagnosis not present

## 2019-10-18 DIAGNOSIS — Z01419 Encounter for gynecological examination (general) (routine) without abnormal findings: Secondary | ICD-10-CM | POA: Diagnosis not present

## 2019-10-21 ENCOUNTER — Other Ambulatory Visit: Payer: Self-pay | Admitting: Obstetrics and Gynecology

## 2019-10-21 DIAGNOSIS — Z9189 Other specified personal risk factors, not elsewhere classified: Secondary | ICD-10-CM

## 2019-11-03 DIAGNOSIS — J3089 Other allergic rhinitis: Secondary | ICD-10-CM | POA: Diagnosis not present

## 2019-11-03 DIAGNOSIS — Z1321 Encounter for screening for nutritional disorder: Secondary | ICD-10-CM | POA: Diagnosis not present

## 2019-11-03 DIAGNOSIS — Z1329 Encounter for screening for other suspected endocrine disorder: Secondary | ICD-10-CM | POA: Diagnosis not present

## 2019-11-03 DIAGNOSIS — Z13228 Encounter for screening for other metabolic disorders: Secondary | ICD-10-CM | POA: Diagnosis not present

## 2019-11-03 DIAGNOSIS — Z13220 Encounter for screening for lipoid disorders: Secondary | ICD-10-CM | POA: Diagnosis not present

## 2019-11-17 DIAGNOSIS — J3089 Other allergic rhinitis: Secondary | ICD-10-CM | POA: Diagnosis not present

## 2019-12-03 ENCOUNTER — Other Ambulatory Visit: Payer: Self-pay

## 2019-12-06 DIAGNOSIS — J3089 Other allergic rhinitis: Secondary | ICD-10-CM | POA: Diagnosis not present

## 2019-12-21 DIAGNOSIS — J3089 Other allergic rhinitis: Secondary | ICD-10-CM | POA: Diagnosis not present

## 2020-01-11 ENCOUNTER — Other Ambulatory Visit: Payer: Self-pay

## 2020-02-01 DIAGNOSIS — J3089 Other allergic rhinitis: Secondary | ICD-10-CM | POA: Diagnosis not present

## 2020-02-15 DIAGNOSIS — J3089 Other allergic rhinitis: Secondary | ICD-10-CM | POA: Diagnosis not present

## 2020-02-29 DIAGNOSIS — J3089 Other allergic rhinitis: Secondary | ICD-10-CM | POA: Diagnosis not present

## 2020-03-14 DIAGNOSIS — J3089 Other allergic rhinitis: Secondary | ICD-10-CM | POA: Diagnosis not present

## 2020-03-28 DIAGNOSIS — J3089 Other allergic rhinitis: Secondary | ICD-10-CM | POA: Diagnosis not present

## 2020-04-17 DIAGNOSIS — J3089 Other allergic rhinitis: Secondary | ICD-10-CM | POA: Diagnosis not present

## 2020-05-05 DIAGNOSIS — J3089 Other allergic rhinitis: Secondary | ICD-10-CM | POA: Diagnosis not present

## 2020-05-23 DIAGNOSIS — E785 Hyperlipidemia, unspecified: Secondary | ICD-10-CM | POA: Diagnosis not present

## 2020-05-23 DIAGNOSIS — J3089 Other allergic rhinitis: Secondary | ICD-10-CM | POA: Diagnosis not present

## 2020-05-25 DIAGNOSIS — J301 Allergic rhinitis due to pollen: Secondary | ICD-10-CM | POA: Diagnosis not present

## 2020-05-25 DIAGNOSIS — J4520 Mild intermittent asthma, uncomplicated: Secondary | ICD-10-CM | POA: Diagnosis not present

## 2020-05-25 DIAGNOSIS — J3081 Allergic rhinitis due to animal (cat) (dog) hair and dander: Secondary | ICD-10-CM | POA: Diagnosis not present

## 2020-05-25 DIAGNOSIS — J3089 Other allergic rhinitis: Secondary | ICD-10-CM | POA: Diagnosis not present

## 2020-06-12 DIAGNOSIS — J3089 Other allergic rhinitis: Secondary | ICD-10-CM | POA: Diagnosis not present

## 2020-06-30 DIAGNOSIS — J3089 Other allergic rhinitis: Secondary | ICD-10-CM | POA: Diagnosis not present

## 2020-07-11 DIAGNOSIS — J3089 Other allergic rhinitis: Secondary | ICD-10-CM | POA: Diagnosis not present

## 2020-07-12 ENCOUNTER — Ambulatory Visit
Admission: RE | Admit: 2020-07-12 | Discharge: 2020-07-12 | Disposition: A | Discharge status: Home / Self Care | Payer: BC Managed Care – PPO | Source: Ambulatory Visit | Attending: Obstetrics and Gynecology | Admitting: Obstetrics and Gynecology

## 2020-07-12 ENCOUNTER — Other Ambulatory Visit: Payer: Self-pay

## 2020-07-12 DIAGNOSIS — R928 Other abnormal and inconclusive findings on diagnostic imaging of breast: Secondary | ICD-10-CM | POA: Diagnosis not present

## 2020-07-12 DIAGNOSIS — Z9189 Other specified personal risk factors, not elsewhere classified: Secondary | ICD-10-CM

## 2020-07-12 MED ORDER — GADOBUTROL 1 MMOL/ML IV SOLN
10.0000 mL | Freq: Once | INTRAVENOUS | Status: AC | PRN
  Administered 2020-07-12: 10 mL via INTRAVENOUS

## 2020-07-27 DIAGNOSIS — J3089 Other allergic rhinitis: Secondary | ICD-10-CM | POA: Diagnosis not present

## 2020-07-31 DIAGNOSIS — Z1231 Encounter for screening mammogram for malignant neoplasm of breast: Secondary | ICD-10-CM | POA: Diagnosis not present

## 2020-08-08 DIAGNOSIS — J3089 Other allergic rhinitis: Secondary | ICD-10-CM | POA: Diagnosis not present

## 2020-08-09 ENCOUNTER — Encounter: Payer: Self-pay | Admitting: Genetic Counselor

## 2020-08-31 DIAGNOSIS — J3089 Other allergic rhinitis: Secondary | ICD-10-CM | POA: Diagnosis not present

## 2020-09-20 DIAGNOSIS — J3089 Other allergic rhinitis: Secondary | ICD-10-CM | POA: Diagnosis not present

## 2020-10-03 DIAGNOSIS — M25532 Pain in left wrist: Secondary | ICD-10-CM | POA: Insufficient documentation

## 2020-10-05 DIAGNOSIS — J3089 Other allergic rhinitis: Secondary | ICD-10-CM | POA: Diagnosis not present

## 2020-10-09 DIAGNOSIS — J3081 Allergic rhinitis due to animal (cat) (dog) hair and dander: Secondary | ICD-10-CM | POA: Diagnosis not present

## 2020-10-09 DIAGNOSIS — J3089 Other allergic rhinitis: Secondary | ICD-10-CM | POA: Diagnosis not present

## 2020-10-12 DIAGNOSIS — J3081 Allergic rhinitis due to animal (cat) (dog) hair and dander: Secondary | ICD-10-CM | POA: Diagnosis not present

## 2020-10-12 DIAGNOSIS — J3089 Other allergic rhinitis: Secondary | ICD-10-CM | POA: Diagnosis not present

## 2020-10-21 DIAGNOSIS — M25532 Pain in left wrist: Secondary | ICD-10-CM | POA: Diagnosis not present

## 2020-10-27 DIAGNOSIS — M25532 Pain in left wrist: Secondary | ICD-10-CM | POA: Diagnosis not present

## 2020-12-13 DIAGNOSIS — Z6838 Body mass index (BMI) 38.0-38.9, adult: Secondary | ICD-10-CM | POA: Diagnosis not present

## 2020-12-13 DIAGNOSIS — Z01419 Encounter for gynecological examination (general) (routine) without abnormal findings: Secondary | ICD-10-CM | POA: Diagnosis not present

## 2020-12-13 DIAGNOSIS — Z1329 Encounter for screening for other suspected endocrine disorder: Secondary | ICD-10-CM | POA: Diagnosis not present

## 2021-01-05 ENCOUNTER — Other Ambulatory Visit: Payer: Self-pay

## 2021-01-05 ENCOUNTER — Other Ambulatory Visit: Payer: BC Managed Care – PPO

## 2021-01-05 DIAGNOSIS — Z20822 Contact with and (suspected) exposure to covid-19: Secondary | ICD-10-CM | POA: Diagnosis not present

## 2021-01-09 LAB — NOVEL CORONAVIRUS, NAA: SARS-CoV-2, NAA: NOT DETECTED

## 2021-02-08 DIAGNOSIS — Z131 Encounter for screening for diabetes mellitus: Secondary | ICD-10-CM | POA: Diagnosis not present

## 2021-02-08 DIAGNOSIS — E039 Hypothyroidism, unspecified: Secondary | ICD-10-CM | POA: Diagnosis not present

## 2021-02-23 DIAGNOSIS — M25532 Pain in left wrist: Secondary | ICD-10-CM | POA: Diagnosis not present

## 2021-07-10 DIAGNOSIS — J3089 Other allergic rhinitis: Secondary | ICD-10-CM | POA: Diagnosis not present

## 2021-08-06 DIAGNOSIS — Z1231 Encounter for screening mammogram for malignant neoplasm of breast: Secondary | ICD-10-CM | POA: Diagnosis not present

## 2021-08-13 DIAGNOSIS — Z79899 Other long term (current) drug therapy: Secondary | ICD-10-CM | POA: Diagnosis not present

## 2021-08-13 DIAGNOSIS — E785 Hyperlipidemia, unspecified: Secondary | ICD-10-CM | POA: Diagnosis not present

## 2021-08-28 DIAGNOSIS — U071 COVID-19: Secondary | ICD-10-CM | POA: Diagnosis not present

## 2021-08-28 DIAGNOSIS — R051 Acute cough: Secondary | ICD-10-CM | POA: Diagnosis not present

## 2021-08-28 DIAGNOSIS — R52 Pain, unspecified: Secondary | ICD-10-CM | POA: Diagnosis not present

## 2021-08-28 DIAGNOSIS — R519 Headache, unspecified: Secondary | ICD-10-CM | POA: Diagnosis not present

## 2021-09-19 DIAGNOSIS — J301 Allergic rhinitis due to pollen: Secondary | ICD-10-CM | POA: Diagnosis not present

## 2021-09-19 DIAGNOSIS — J3081 Allergic rhinitis due to animal (cat) (dog) hair and dander: Secondary | ICD-10-CM | POA: Diagnosis not present

## 2021-09-19 DIAGNOSIS — J3089 Other allergic rhinitis: Secondary | ICD-10-CM | POA: Diagnosis not present

## 2021-09-19 DIAGNOSIS — R051 Acute cough: Secondary | ICD-10-CM | POA: Diagnosis not present

## 2021-11-02 DIAGNOSIS — H04123 Dry eye syndrome of bilateral lacrimal glands: Secondary | ICD-10-CM | POA: Diagnosis not present

## 2021-12-06 DIAGNOSIS — R197 Diarrhea, unspecified: Secondary | ICD-10-CM | POA: Diagnosis not present

## 2022-01-30 DIAGNOSIS — R011 Cardiac murmur, unspecified: Secondary | ICD-10-CM | POA: Insufficient documentation

## 2022-01-31 DIAGNOSIS — Z1329 Encounter for screening for other suspected endocrine disorder: Secondary | ICD-10-CM | POA: Diagnosis not present

## 2022-01-31 DIAGNOSIS — Z1321 Encounter for screening for nutritional disorder: Secondary | ICD-10-CM | POA: Diagnosis not present

## 2022-01-31 DIAGNOSIS — G4700 Insomnia, unspecified: Secondary | ICD-10-CM | POA: Insufficient documentation

## 2022-01-31 DIAGNOSIS — Z13220 Encounter for screening for lipoid disorders: Secondary | ICD-10-CM | POA: Diagnosis not present

## 2022-01-31 DIAGNOSIS — N951 Menopausal and female climacteric states: Secondary | ICD-10-CM | POA: Diagnosis not present

## 2022-01-31 DIAGNOSIS — Z6839 Body mass index (BMI) 39.0-39.9, adult: Secondary | ICD-10-CM | POA: Diagnosis not present

## 2022-01-31 DIAGNOSIS — Z01419 Encounter for gynecological examination (general) (routine) without abnormal findings: Secondary | ICD-10-CM | POA: Diagnosis not present

## 2022-02-04 ENCOUNTER — Other Ambulatory Visit: Payer: Self-pay | Admitting: Obstetrics and Gynecology

## 2022-02-04 DIAGNOSIS — Z803 Family history of malignant neoplasm of breast: Secondary | ICD-10-CM

## 2022-03-26 DIAGNOSIS — H04123 Dry eye syndrome of bilateral lacrimal glands: Secondary | ICD-10-CM | POA: Diagnosis not present

## 2022-04-25 DIAGNOSIS — J3089 Other allergic rhinitis: Secondary | ICD-10-CM | POA: Diagnosis not present

## 2022-05-06 ENCOUNTER — Ambulatory Visit
Admission: RE | Admit: 2022-05-06 | Discharge: 2022-05-06 | Disposition: A | Discharge status: Home / Self Care | Payer: BC Managed Care – PPO | Source: Ambulatory Visit | Attending: Obstetrics and Gynecology | Admitting: Obstetrics and Gynecology

## 2022-05-06 DIAGNOSIS — Z803 Family history of malignant neoplasm of breast: Secondary | ICD-10-CM | POA: Diagnosis not present

## 2022-05-06 MED ORDER — GADOBUTROL 1 MMOL/ML IV SOLN
9.0000 mL | Freq: Once | INTRAVENOUS | Status: AC | PRN
  Administered 2022-05-06: 9 mL via INTRAVENOUS

## 2022-07-13 DIAGNOSIS — J0140 Acute pansinusitis, unspecified: Secondary | ICD-10-CM | POA: Diagnosis not present

## 2022-07-13 DIAGNOSIS — H938X1 Other specified disorders of right ear: Secondary | ICD-10-CM | POA: Diagnosis not present

## 2022-07-13 DIAGNOSIS — H81391 Other peripheral vertigo, right ear: Secondary | ICD-10-CM | POA: Diagnosis not present

## 2022-07-24 DIAGNOSIS — D125 Benign neoplasm of sigmoid colon: Secondary | ICD-10-CM | POA: Diagnosis not present

## 2022-07-24 DIAGNOSIS — Z1211 Encounter for screening for malignant neoplasm of colon: Secondary | ICD-10-CM | POA: Diagnosis not present

## 2022-07-24 DIAGNOSIS — D123 Benign neoplasm of transverse colon: Secondary | ICD-10-CM | POA: Diagnosis not present

## 2022-07-24 DIAGNOSIS — D122 Benign neoplasm of ascending colon: Secondary | ICD-10-CM | POA: Diagnosis not present

## 2022-07-24 DIAGNOSIS — K5730 Diverticulosis of large intestine without perforation or abscess without bleeding: Secondary | ICD-10-CM | POA: Diagnosis not present

## 2022-07-26 ENCOUNTER — Encounter: Payer: Self-pay | Admitting: Family

## 2022-07-26 ENCOUNTER — Ambulatory Visit (INDEPENDENT_AMBULATORY_CARE_PROVIDER_SITE_OTHER): Payer: BC Managed Care – PPO | Admitting: Family

## 2022-07-26 VITALS — BP 128/70 | HR 79 | Temp 98.7°F | Resp 16 | Ht 62.5 in | Wt 227.0 lb

## 2022-07-26 DIAGNOSIS — Z7689 Persons encountering health services in other specified circumstances: Secondary | ICD-10-CM

## 2022-07-26 DIAGNOSIS — J3089 Other allergic rhinitis: Secondary | ICD-10-CM

## 2022-07-26 DIAGNOSIS — E7849 Other hyperlipidemia: Secondary | ICD-10-CM | POA: Insufficient documentation

## 2022-07-26 DIAGNOSIS — G479 Sleep disorder, unspecified: Secondary | ICD-10-CM | POA: Insufficient documentation

## 2022-07-26 DIAGNOSIS — N951 Menopausal and female climacteric states: Secondary | ICD-10-CM

## 2022-07-26 DIAGNOSIS — Z6841 Body Mass Index (BMI) 40.0 and over, adult: Secondary | ICD-10-CM

## 2022-07-26 DIAGNOSIS — E782 Mixed hyperlipidemia: Secondary | ICD-10-CM

## 2022-07-26 NOTE — Patient Instructions (Addendum)
Welcome to our clinic, I am happy to have you as my new patient. I am excited to continue on this healthcare journey with you.   I have created an order for lab work today during our visit.  Please schedule an appointment on your way out to return to the lab at your convenience. Please return fasting at your lab appointment (meaning you can only drink black coffee and or water prior to your appointment). I will reach out to you in regards to the labs when I receive the results.      Please keep in mind Any my chart messages you send have up to a three business day turnaround for a response.  Phone calls may take up to a one full business day turnaround for a  response.   If you need a medication refill I recommend you request it through the pharmacy as this is easiest for us rather than sending a message and or phone call.   Due to recent changes in healthcare laws, you may see results of your imaging and/or laboratory studies on MyChart before I have had a chance to review them.  I understand that in some cases there may be results that are confusing or concerning to you. Please understand that not all results are received at the same time and often I may need to interpret multiple results in order to provide you with the best plan of care or course of treatment. Therefore, I ask that you please give me 2 business days to thoroughly review all your results before contacting my office for clarification. Should we see a critical lab result, you will be contacted sooner.   It was a pleasure seeing you today! Please do not hesitate to reach out with any questions and or concerns.  Regards,   Yashas Camilli FNP-C  

## 2022-07-26 NOTE — Progress Notes (Signed)
New Patient Office Visit  Subjective:  Patient ID: Mia Burnett, female    DOB: Apr 06, 1956  Age: 66 y.o. MRN: [id number]  CC:  Chief Complaint  Patient presents with  . Establish Care    HPI Mia Burnett is here to establish care as a new patient.  Prior provider was: Gaynelle Arabian at [facility] Pt is without acute concerns.  Gyn usually does her lab work , last did her lab work august 2022  MR Mammogram: negative , has f/u mammogram in august 2023 Dexa: due next year?  GYN Dr. Radene Knee  Pap 2021? Tdap: five years ago at [facility]  Colonoscopy: 7/26, seven polpys pending pathology done with [facility]   chronic concerns:  Allergic rhinitis: allergic to dust and mold. Biweekly allergy injections has epi pen when needed. Also takes Singulair 10 mg   Past Medical History:  Diagnosis Date  . Hyperlipidemia     Past Surgical History:  Procedure Laterality Date  . CHOLECYSTECTOMY  1993  . REDUCTION MAMMAPLASTY Bilateral    2003  . ROTATOR CUFF REPAIR Right    2016  . SHOULDER SURGERY Left    2005    Family History  Problem Relation Age of Onset  . Breast cancer Mother 62  . Early death Maternal Grandmother   . Early death Maternal Grandfather   . Breast cancer Half-Sister   . Breast cancer Half-Sister   . Breast cancer Half-Sister   . Healthy Half-Brother     Social History   Socioeconomic History  . Marital status: Widowed    Spouse name: Not on file  . Number of children: 1  . Years of education: Not on file  . Highest education level: Not on file  Occupational History    Employer: [employer]    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 [employer]  Tobacco Use  . Smoking status: Never  . Smokeless tobacco: Never  Vaping Use  . Vaping Use: Never used  Substance and Sexual Activity  . Alcohol use: Yes    Comment: 1 glass of wine every 2-3 weeks  . Drug use: Never  . Sexual activity: Not Currently    Partners: Male  Other Topics Concern  . Not  on file  Social History Narrative   One daughter age 37   Social Determinants of Health   Financial Resource Strain: Not on file  Food Insecurity: Not on file  Transportation Needs: Not on file  Physical Activity: Not on file  Stress: Not on file  Social Connections: Not on file  Intimate Partner Violence: Not on file    Outpatient Medications Prior to Visit  Medication Sig Dispense Refill  . albuterol (VENTOLIN HFA) 108 (90 Base) MCG/ACT inhaler Inhale into the lungs.    . Calcium Carbonate-Vit D-Min (CALCIUM 1200 PO) Take by mouth.    . EPINEPHrine 0.3 mg/0.3 mL IJ SOAJ injection AUTO-INJECTOR INTO OUTER THIGH INJECTION AS NEEDED FOR ANAPHYLAXIS.    . fluticasone (FLONASE ALLERGY RELIEF) 50 MCG/ACT nasal spray 2 sprays    . montelukast (SINGULAIR) 10 MG tablet     . Multiple Vitamin (MULTIVITAMIN) capsule Take 1 capsule by mouth daily.    Marland Kitchen PARoxetine (PAXIL) 10 MG tablet Take 10 mg by mouth daily.    Marland Kitchen PRESCRIPTION MEDICATION Allergy injections    . rosuvastatin (CRESTOR) 10 MG tablet Take 10 mg by mouth at bedtime.    Marland Kitchen zolpidem (AMBIEN) 10 MG tablet     . loratadine (  CLARITIN) 10 MG tablet Take 10 mg by mouth daily.    . meloxicam (MOBIC) 15 MG tablet Take 1 tablet (15 mg total) by mouth daily. 90 tablet 1  . meloxicam (MOBIC) 15 MG tablet Take 1 tablet (15 mg total) by mouth daily. 30 tablet 3  . meloxicam (MOBIC) 15 MG tablet TAKE 1 TABLET DAILY 90 tablet 2  . methylPREDNISolone (MEDROL) 4 MG TBPK tablet Tapering 6 day dose pack 21 tablet 0  . mometasone (NASONEX) 50 MCG/ACT nasal spray Place 2 sprays into the nose daily.    . OYSTER SHELL CALCIUM PO Take 1,200 mg by mouth 2 (two) times daily.    Norvel Richards 80 MCG/ACT AERS Place 2 sprays into both nostrils daily.  5   No facility-administered medications prior to visit.    Allergies  Allergen Reactions  . Clarithromycin Other (See Comments)    Abdominal pain   . Sulfonamide Derivatives Hives and Rash    itching     ROS Review of Systems  Review of Systems  Respiratory:  Negative for shortness of breath.   Cardiovascular:  Negative for chest pain and palpitations.  Gastrointestinal:  Negative for constipation and diarrhea.  Genitourinary:  Negative for dysuria, frequency and urgency.  Musculoskeletal:  Negative for myalgias.  Psychiatric/Behavioral:  Negative for depression and suicidal ideas.   All other systems reviewed and are negative.    Objective:    Physical Exam  Gen: NAD, resting comfortably CV: RRR with no murmurs appreciated Pulm: NWOB, CTAB with no crackles, wheezes, or rhonchi Skin: warm, dry Psych: Normal affect and thought content  BP 128/70   Pulse 79   Temp 98.7 F (37.1 C)   Resp 16   Ht 5' 2.5" (1.588 m)   Wt 227 lb (103 kg)   SpO2 97%   BMI 40.86 kg/m  Wt Readings from Last 3 Encounters:  07/26/22 227 lb (103 kg)  11/09/14 200 lb (90.7 kg)     Health Maintenance Due  Topic Date Due  . COVID-19 Vaccine (1) Never done  . HIV Screening  Never done  . Hepatitis C Screening  Never done  . COLONOSCOPY (Pts 45-61yr Insurance coverage will need to be confirmed)  Never done  . Zoster Vaccines- Shingrix (1 of 2) Never done  . MAMMOGRAM  06/29/2014  . PAP SMEAR-Modifier  08/28/2018  . INFLUENZA VACCINE  07/30/2022    There are no preventive care reminders to display for this patient.  No results found for: "TSH" No results found for: "WBC", "HGB", "HCT", "MCV", "PLT" No results found for: "NA", "K", "CHLORIDE", "CO2", "GLUCOSE", "BUN", "CREATININE", "BILITOT", "ALKPHOS", "AST", "ALT", "PROT", "ALBUMIN", "CALCIUM", "ANIONGAP", "EGFR", "GFR" No results found for: "CHOL" No results found for: "HDL" No results found for: "LDLCALC" No results found for: "TRIG" No results found for: "CHOLHDL" No results found for: "HGBA1C"    Assessment & Plan:   Problem List Items Addressed This Visit       Cardiovascular and Mediastinum   Hot flashes due to  menopause    Continue paxil 10 mg as helping with symptoms       Relevant Medications   EPINEPHrine 0.3 mg/0.3 mL IJ SOAJ injection   rosuvastatin (CRESTOR) 10 MG tablet     Respiratory   Allergic rhinitis    Continue with allergy medications and allergy injections Epi pen on hand as directed.         Other   Mixed hyperlipidemia - Primary  Continue rosuvastatin 10 mg  Work on low chol diet exercise as tolerated      Relevant Medications   EPINEPHrine 0.3 mg/0.3 mL IJ SOAJ injection   rosuvastatin (CRESTOR) 10 MG tablet   Other Relevant Orders   Comprehensive metabolic panel   Lipid panel   CBC with Differential/Platelet   Class 3 severe obesity due to excess calories without serious comorbidity with body mass index (BMI) of 40.0 to 44.9 in adult [facility])    Order a1c pending results        Relevant Orders   Comprehensive metabolic panel   CBC with Differential/Platelet   Hemoglobin A1c   Sleep disorder   Encounter to establish care with new doctor    D/w with pt and went over chronic and acute history as indicated Went over family social and medical/surgical history  Discussed h/o lab results and or diagnostic testing as indicated         No orders of the defined types were placed in this encounter.   Follow-up: Return in about 6 months (around 01/26/2023) for regular follow up .    Eugenia Pancoast, FNP

## 2022-07-27 ENCOUNTER — Encounter: Payer: Self-pay | Admitting: Family

## 2022-07-29 NOTE — Telephone Encounter (Signed)
Printed and put in provider box

## 2022-07-30 DIAGNOSIS — Z7689 Persons encountering health services in other specified circumstances: Secondary | ICD-10-CM | POA: Insufficient documentation

## 2022-07-30 DIAGNOSIS — N951 Menopausal and female climacteric states: Secondary | ICD-10-CM | POA: Insufficient documentation

## 2022-07-30 NOTE — Assessment & Plan Note (Signed)
D/w with pt and went over chronic and acute history as indicated Went over family social and medical/surgical history  Discussed h/o lab results and or diagnostic testing as indicated   

## 2022-07-30 NOTE — Assessment & Plan Note (Signed)
Continue paxil 10 mg as helping with symptoms

## 2022-07-30 NOTE — Assessment & Plan Note (Signed)
Continue rosuvastatin 10 mg  Work on low chol diet exercise as tolerated

## 2022-07-30 NOTE — Assessment & Plan Note (Signed)
Continue with allergy medications and allergy injections Epi pen on hand as directed.

## 2022-07-30 NOTE — Assessment & Plan Note (Signed)
Order a1c pending results 

## 2022-07-31 ENCOUNTER — Other Ambulatory Visit (INDEPENDENT_AMBULATORY_CARE_PROVIDER_SITE_OTHER): Payer: BC Managed Care – PPO

## 2022-07-31 DIAGNOSIS — Z6841 Body Mass Index (BMI) 40.0 and over, adult: Secondary | ICD-10-CM

## 2022-07-31 DIAGNOSIS — D72829 Elevated white blood cell count, unspecified: Secondary | ICD-10-CM

## 2022-07-31 DIAGNOSIS — E782 Mixed hyperlipidemia: Secondary | ICD-10-CM | POA: Diagnosis not present

## 2022-07-31 LAB — CBC WITH DIFFERENTIAL/PLATELET
Basophils Absolute: 0 10*3/uL (ref 0.0–0.1)
Basophils Relative: 0.4 % (ref 0.0–3.0)
Eosinophils Absolute: 0.2 10*3/uL (ref 0.0–0.7)
Eosinophils Relative: 2.9 % (ref 0.0–5.0)
HCT: 40.6 % (ref 36.0–46.0)
Hemoglobin: 13.3 g/dL (ref 12.0–15.0)
Lymphocytes Relative: 40.3 % (ref 12.0–46.0)
Lymphs Abs: 2.5 10*3/uL (ref 0.7–4.0)
MCHC: 32.8 g/dL (ref 30.0–36.0)
MCV: 85.9 fl (ref 78.0–100.0)
Monocytes Absolute: 0.5 10*3/uL (ref 0.1–1.0)
Monocytes Relative: 8.2 % (ref 3.0–12.0)
Neutro Abs: 2.9 10*3/uL (ref 1.4–7.7)
Neutrophils Relative %: 48.2 % (ref 43.0–77.0)
Platelets: 255 10*3/uL (ref 150.0–400.0)
RBC: 4.73 Mil/uL (ref 3.87–5.11)
RDW: 14.2 % (ref 11.5–15.5)
WBC: 6.1 10*3/uL (ref 4.0–10.5)

## 2022-07-31 LAB — COMPREHENSIVE METABOLIC PANEL
ALT: 16 U/L (ref 0–35)
AST: 20 U/L (ref 0–37)
Albumin: 4.3 g/dL (ref 3.5–5.2)
Alkaline Phosphatase: 55 U/L (ref 39–117)
BUN: 15 mg/dL (ref 6–23)
CO2: 30 mEq/L (ref 19–32)
Calcium: 9.5 mg/dL (ref 8.4–10.5)
Chloride: 100 mEq/L (ref 96–112)
Creatinine, Ser: 0.8 mg/dL (ref 0.40–1.20)
GFR: 77.6 mL/min (ref >60.00)
Glucose, Bld: 92 mg/dL (ref 70–99)
Potassium: 4.5 mEq/L (ref 3.5–5.1)
Sodium: 136 mEq/L (ref 135–145)
Total Bilirubin: 0.6 mg/dL (ref 0.2–1.2)
Total Protein: 7.1 g/dL (ref 6.0–8.3)

## 2022-07-31 LAB — LIPID PANEL
Cholesterol: 177 mg/dL (ref 0–200)
HDL: 53.9 mg/dL (ref >39.00)
LDL Cholesterol: 101 mg/dL — ABNORMAL HIGH (ref 0–99)
NonHDL: 123.29
Total CHOL/HDL Ratio: 3
Triglycerides: 111 mg/dL (ref 0.0–149.0)
VLDL: 22.2 mg/dL (ref 0.0–40.0)

## 2022-07-31 LAB — HEMOGLOBIN A1C: Hgb A1c MFr Bld: 6 % (ref 4.6–6.5)

## 2022-08-12 DIAGNOSIS — Z1231 Encounter for screening mammogram for malignant neoplasm of breast: Secondary | ICD-10-CM | POA: Diagnosis not present

## 2022-08-12 LAB — HM MAMMOGRAPHY

## 2022-08-21 DIAGNOSIS — L578 Other skin changes due to chronic exposure to nonionizing radiation: Secondary | ICD-10-CM | POA: Diagnosis not present

## 2022-08-21 DIAGNOSIS — D1801 Hemangioma of skin and subcutaneous tissue: Secondary | ICD-10-CM | POA: Diagnosis not present

## 2022-08-21 DIAGNOSIS — L821 Other seborrheic keratosis: Secondary | ICD-10-CM | POA: Diagnosis not present

## 2022-08-21 DIAGNOSIS — L814 Other melanin hyperpigmentation: Secondary | ICD-10-CM | POA: Diagnosis not present

## 2022-10-07 DIAGNOSIS — J3089 Other allergic rhinitis: Secondary | ICD-10-CM | POA: Diagnosis not present

## 2022-10-07 DIAGNOSIS — J4520 Mild intermittent asthma, uncomplicated: Secondary | ICD-10-CM | POA: Diagnosis not present

## 2022-10-07 DIAGNOSIS — J301 Allergic rhinitis due to pollen: Secondary | ICD-10-CM | POA: Diagnosis not present

## 2022-10-07 DIAGNOSIS — J3081 Allergic rhinitis due to animal (cat) (dog) hair and dander: Secondary | ICD-10-CM | POA: Diagnosis not present

## 2022-11-04 ENCOUNTER — Encounter: Payer: Self-pay | Admitting: Family

## 2022-11-05 DIAGNOSIS — L820 Inflamed seborrheic keratosis: Secondary | ICD-10-CM | POA: Diagnosis not present

## 2023-01-27 ENCOUNTER — Ambulatory Visit: Payer: BC Managed Care – PPO | Admitting: Family

## 2023-01-28 ENCOUNTER — Ambulatory Visit (INDEPENDENT_AMBULATORY_CARE_PROVIDER_SITE_OTHER): Payer: Medicare HMO | Admitting: Family

## 2023-01-28 ENCOUNTER — Encounter: Payer: Self-pay | Admitting: Family

## 2023-01-28 VITALS — BP 136/84 | HR 80 | Temp 98.9°F | Ht 62.5 in | Wt 224.8 lb

## 2023-01-28 DIAGNOSIS — K219 Gastro-esophageal reflux disease without esophagitis: Secondary | ICD-10-CM | POA: Diagnosis not present

## 2023-01-28 DIAGNOSIS — Z79899 Other long term (current) drug therapy: Secondary | ICD-10-CM

## 2023-01-28 DIAGNOSIS — R7303 Prediabetes: Secondary | ICD-10-CM | POA: Insufficient documentation

## 2023-01-28 DIAGNOSIS — Z6841 Body Mass Index (BMI) 40.0 and over, adult: Secondary | ICD-10-CM

## 2023-01-28 DIAGNOSIS — G479 Sleep disorder, unspecified: Secondary | ICD-10-CM

## 2023-01-28 DIAGNOSIS — R011 Cardiac murmur, unspecified: Secondary | ICD-10-CM | POA: Diagnosis not present

## 2023-01-28 DIAGNOSIS — Z9189 Other specified personal risk factors, not elsewhere classified: Secondary | ICD-10-CM | POA: Diagnosis not present

## 2023-01-28 DIAGNOSIS — J3089 Other allergic rhinitis: Secondary | ICD-10-CM

## 2023-01-28 DIAGNOSIS — N951 Menopausal and female climacteric states: Secondary | ICD-10-CM

## 2023-01-28 DIAGNOSIS — E782 Mixed hyperlipidemia: Secondary | ICD-10-CM | POA: Diagnosis not present

## 2023-01-28 MED ORDER — OMEPRAZOLE 20 MG PO CPDR: 20.0000 mg | DELAYED_RELEASE_CAPSULE | Freq: Every day | ORAL | 3 refills | Status: DC

## 2023-01-28 NOTE — Assessment & Plan Note (Addendum)
Stable. Continue Paxil 10mg daily

## 2023-01-28 NOTE — Assessment & Plan Note (Addendum)
Educated patient on importance of low fat diet. Commended patient on weight loss and regular exercise. Referral made to [facility].   I evaluated the patient,  was consulted regarding plans for treatment of care, and agree with the assessment and plan per Joya Gaskins, RN, DNP student.  -Eugenia Pancoast, FNP-C

## 2023-01-28 NOTE — Progress Notes (Signed)
Established Patient Office Visit  Subjective:   Patient ID: Mia Burnett, female    DOB: 1956-11-04  Age: 67 y.o. MRN: [id number]  CC:  Chief Complaint  Patient presents with   Medical Management of Chronic Issues    HPI: Mia Burnett is a 67 y.o. female presenting today for follow-up of chronic conditions.   Prediabetes/Obesity: states that she has not been doing great with diet. States that she walks a mile 3 times a week. She states that she needs accountability.  Would like a referral to [facility].  Lab Results  Component Value Date   HGBA1C 6.0 07/31/2022   This SmartLink has not been configured with any valid records.   Wt Readings from Last 3 Encounters:  01/28/23 224 lb 12.8 oz (102 kg)  07/26/22 227 lb (103 kg)  11/09/14 200 lb (90.7 kg)      Indigestion/heartburn: Takes OTC Prilosec daily for the past month. Which she states relieves indigestion. Reports that she does eat a lot of spicy foods. Denies nausea/vomiting, constipation, diarrhea.   Sleep disorder: controlled. Takes Ambien 5mg  nightly.   Allergic rhinitis: continues to get allergy injections every other week. Last injection  was last  Wednesday. Continues to take Singulair 10mg  and Flonase 4mcg daily. Has Epi-pen available if needed.   Hot flashes: controlled. Continues to take Paxil 10mg .   HLD: takes Rosuvastatin 10mg  daily. Denies muscle aches/pains.  Lab Results  Component Value Date   CHOL 177 07/31/2022   HDL 53.90 07/31/2022   LDLCALC 101 (H) 07/31/2022   TRIG 111.0 07/31/2022   CHOLHDL 3 07/31/2022       ROS: Negative unless specifically indicated above in HPI.   Relevant past medical history reviewed and updated as indicated.   Allergies and medications reviewed and updated.   Current Outpatient Medications:    albuterol (VENTOLIN HFA) 108 (90 Base) MCG/ACT inhaler, Inhale into the lungs., Disp: , Rfl:    Calcium Carbonate-Vit D-Min (CALCIUM 1200 PO), Take  by mouth., Disp: , Rfl:    EPINEPHrine 0.3 mg/0.3 mL IJ SOAJ injection, AUTO-INJECTOR INTO OUTER THIGH INJECTION AS NEEDED FOR ANAPHYLAXIS., Disp: , Rfl:    fluticasone (FLONASE ALLERGY RELIEF) 50 MCG/ACT nasal spray, 2 sprays, Disp: , Rfl:    montelukast (SINGULAIR) 10 MG tablet, , Disp: , Rfl:    Multiple Vitamin (MULTIVITAMIN) capsule, Take 1 capsule by mouth daily., Disp: , Rfl:    omeprazole (PRILOSEC) 20 MG capsule, Take 1 capsule (20 mg total) by mouth daily., Disp: 30 capsule, Rfl: 3   PARoxetine (PAXIL) 10 MG tablet, Take 10 mg by mouth daily., Disp: , Rfl:    PRESCRIPTION MEDICATION, Allergy injections, Disp: , Rfl:    rosuvastatin (CRESTOR) 10 MG tablet, Take 10 mg by mouth at bedtime., Disp: , Rfl:    zolpidem (AMBIEN) 10 MG tablet, , Disp: , Rfl:   Allergies  Allergen Reactions   Misc. Sulfonamide Containing Compounds Hives and Other (See Comments)   Other Itching   Clarithromycin Other (See Comments)    Abdominal pain    Sulfonamide Derivatives Hives and Rash    itching    Objective:   BP 136/84 Comment: right  Pulse 80   Temp 98.9 F (37.2 C) (Oral)   Ht 5' 2.5" (1.588 m)   Wt 224 lb 12.8 oz (102 kg)   SpO2 98%   BMI 40.46 kg/m    Physical Exam Vitals reviewed.  Constitutional:  Appearance: She is obese.  HENT:     Right Ear: Tympanic membrane, ear canal and external ear normal.     Left Ear: Tympanic membrane, ear canal and external ear normal.     Nose: Nose normal.     Mouth/Throat:     Mouth: Mucous membranes are moist.  Cardiovascular:     Rate and Rhythm: Normal rate and regular rhythm.     Pulses: Normal pulses.     Heart sounds: Murmur heard.  Pulmonary:     Effort: Pulmonary effort is normal.     Breath sounds: Normal breath sounds.  Abdominal:     General: Bowel sounds are normal.     Palpations: Abdomen is soft.  Musculoskeletal:     Right lower leg: No edema.     Left lower leg: No edema.  Skin:    General: Skin is warm and dry.   Neurological:     General: No focal deficit present.     Mental Status: She is alert.  Psychiatric:        Mood and Affect: Mood normal.        Behavior: Behavior normal.        Thought Content: Thought content normal.        Judgment: Judgment normal.     Assessment & Plan:  Prediabetes Assessment & Plan: Hgb A1C ordered and pending.   Orders: -     Amb Ref to Medical Weight Management -     Hemoglobin A1c; Future  Mixed hyperlipidemia Assessment & Plan: Stable. Continue Rosuvastatin 10mg  daily. Lipid panel and hepatic panel ordered and pending.   Orders: -     Lipid panel; Future  Sleep disorder Assessment & Plan: Stable. Continue taking Ambien 5mg  nightly.   At risk for diabetes mellitus -     Amb Ref to Medical Weight Management  On statin therapy -     Hepatic function panel; Future  Class 3 severe obesity due to excess calories with serious comorbidity and body mass index (BMI) of 40.0 to 44.9 in adult (HCC) -     Amb Ref to Medical Weight Management  Hot flashes due to menopause Assessment & Plan: Stable. Continue Paxil 10mg  daily.    Systolic murmur Assessment & Plan: Stable.    GERD without esophagitis Assessment & Plan: Rx sent to patient's pharmacy for Omeprazole 20mg  daily. Educated patient to try to lessen/avoid spicy foods, caffeine, chocolate, fried/fatty foods to help relieve heartburn.   Orders: -     Omeprazole; Take 1 capsule (20 mg total) by mouth daily.  Dispense: 30 capsule; Refill: 3  Non-seasonal allergic rhinitis, unspecified trigger Assessment & Plan: Stable. Continue allergy injections, Singulair 10mg  daily, and Flonase 93mcg daily.    Class 3 severe obesity due to excess calories without serious comorbidity with body mass index (BMI) of 40.0 to 44.9 in adult [facility]) Assessment & Plan: Educated patient on importance of low fat diet. Commended patient on weight loss and regular exercise. Referral made to [facility].   I evaluated the patient,  was consulted regarding plans for treatment of care, and agree with the assessment and plan per Joya Gaskins, RN, DNP student.  Eugenia Pancoast, FNP-C       Follow up plan: Return in about 6 months (around 07/29/2023) for f/u obesity, lab only appointment next available for pt .  Eugenia Pancoast, FNP AGNP-student

## 2023-01-28 NOTE — Assessment & Plan Note (Signed)
Stable. Continue taking Ambien 5mg  nightly.

## 2023-01-28 NOTE — Patient Instructions (Addendum)
   I have created an order for lab work today during our visit.  Please schedule an appointment on your way out to return to the lab at your convenience. Please return fasting at your lab appointment (meaning you can only drink black coffee and or water prior to your appointment). I will reach out to you in regards to the labs when I receive the results.   A referral was placed today for weight loss medication and management.  Please let us know if you have not heard back within 2 weeks about the referral.   Regards,   Arria Naim FNP-C

## 2023-01-28 NOTE — Assessment & Plan Note (Signed)
Stable

## 2023-01-28 NOTE — Assessment & Plan Note (Signed)
Hgb A1C ordered and pending.

## 2023-01-28 NOTE — Assessment & Plan Note (Signed)
Rx sent to patient's pharmacy for Omeprazole 20mg  daily. Educated patient to try to lessen/avoid spicy foods, caffeine, chocolate, fried/fatty foods to help relieve heartburn.

## 2023-01-28 NOTE — Assessment & Plan Note (Signed)
Stable. Continue allergy injections, Singulair 10mg  daily, and Flonase 57mcg daily.

## 2023-01-28 NOTE — Progress Notes (Signed)
Established Patient Office Visit  Subjective:      CC:  Chief Complaint  Patient presents with   Medical Management of Chronic Issues    HPI: Mia Burnett is a 67 y.o. female presenting on 01/28/2023 for Medical Management of Chronic Issues .  HLD: crestor 10 mg tolerating well. No muscle pains.  Trying to work on low chol diet however needs accountability  Obesity: wants referral to medical weight and wellness, needs accountability.       Social history:  Relevant past medical, surgical, family and social history reviewed and updated as indicated. Interim medical history since our last visit reviewed.  Allergies and medications reviewed and updated.  DATA REVIEWED: CHART IN EPIC     ROS: Negative unless specifically indicated above in HPI.    Current Outpatient Medications:    albuterol (VENTOLIN HFA) 108 (90 Base) MCG/ACT inhaler, Inhale into the lungs., Disp: , Rfl:    Calcium Carbonate-Vit D-Min (CALCIUM 1200 PO), Take by mouth., Disp: , Rfl:    EPINEPHrine 0.3 mg/0.3 mL IJ SOAJ injection, AUTO-INJECTOR INTO OUTER THIGH INJECTION AS NEEDED FOR ANAPHYLAXIS., Disp: , Rfl:    fluticasone (FLONASE ALLERGY RELIEF) 50 MCG/ACT nasal spray, 2 sprays, Disp: , Rfl:    montelukast (SINGULAIR) 10 MG tablet, , Disp: , Rfl:    Multiple Vitamin (MULTIVITAMIN) capsule, Take 1 capsule by mouth daily., Disp: , Rfl:    omeprazole (PRILOSEC) 20 MG capsule, Take 1 capsule (20 mg total) by mouth daily., Disp: 30 capsule, Rfl: 3   PARoxetine (PAXIL) 10 MG tablet, Take 10 mg by mouth daily., Disp: , Rfl:    PRESCRIPTION MEDICATION, Allergy injections, Disp: , Rfl:    rosuvastatin (CRESTOR) 10 MG tablet, Take 10 mg by mouth at bedtime., Disp: , Rfl:    zolpidem (AMBIEN) 10 MG tablet, , Disp: , Rfl:       Objective:    BP 136/84 Comment: right  Pulse 80   Temp 98.9 F (37.2 C) (Oral)   Ht 5' 2.5" (1.588 m)   Wt 224 lb 12.8 oz (102 kg)   SpO2 98%   BMI 40.46 kg/m   Wt  Readings from Last 3 Encounters:  01/28/23 224 lb 12.8 oz (102 kg)  07/26/22 227 lb (103 kg)  11/09/14 200 lb (90.7 kg)    Physical Exam  Physical Exam Constitutional:      General: not in acute distress.    Appearance: Normal appearance. normal weight. is not ill-appearing, toxic-appearing or diaphoretic.  Cardiovascular:     Rate and Rhythm: Normal rate.  Pulmonary:     Effort: Pulmonary effort is normal.  Musculoskeletal:        General: Normal range of motion.  Neurological:     General: No focal deficit present.     Mental Status: alert and oriented to person, place, and time. Mental status is at baseline.  Psychiatric:        Mood and Affect: Mood normal.        Behavior: Behavior normal.        Thought Content: Thought content normal.        Judgment: Judgment normal.        Assessment & Plan:  Prediabetes Assessment & Plan: Hgb A1C ordered and pending.   Orders: -     Amb Ref to Medical Weight Management -     Hemoglobin A1c; Future  Mixed hyperlipidemia Assessment & Plan: Stable. Continue Rosuvastatin 10mg  daily. Lipid panel and hepatic  panel ordered and pending.   Orders: -     Lipid panel; Future  Sleep disorder Assessment & Plan: Stable. Continue taking Ambien 5mg  nightly.   At risk for diabetes mellitus -     Amb Ref to Medical Weight Management  On statin therapy -     Hepatic function panel; Future  Class 3 severe obesity due to excess calories with serious comorbidity and body mass index (BMI) of 40.0 to 44.9 in adult (HCC) -     Amb Ref to Medical Weight Management  Hot flashes due to menopause Assessment & Plan: Stable. Continue Paxil 10mg  daily.    Systolic murmur Assessment & Plan: Stable.    GERD without esophagitis Assessment & Plan: Rx sent to patient's pharmacy for Omeprazole 20mg  daily. Educated patient to try to lessen/avoid spicy foods, caffeine, chocolate, fried/fatty foods to help relieve heartburn.   Orders: -      Omeprazole; Take 1 capsule (20 mg total) by mouth daily.  Dispense: 30 capsule; Refill: 3  Non-seasonal allergic rhinitis, unspecified trigger Assessment & Plan: Stable. Continue allergy injections, Singulair 10mg  daily, and Flonase 43mcg daily.    Class 3 severe obesity due to excess calories without serious comorbidity with body mass index (BMI) of 40.0 to 44.9 in adult [facility]) Assessment & Plan: Educated patient on importance of low fat diet. Commended patient on weight loss and regular exercise. Referral made to [facility].   I evaluated the patient,  was consulted regarding plans for treatment of care, and agree with the assessment and plan per Joya Gaskins, RN, DNP student.  Eugenia Pancoast, FNP-C       Return in about 6 months (around 07/29/2023) for f/u obesity, lab only appointment next available for pt .  Eugenia Pancoast, MSN, APRN, FNP-C [place]

## 2023-01-28 NOTE — Assessment & Plan Note (Addendum)
Stable. Continue Rosuvastatin 10mg  daily. Lipid panel and hepatic panel ordered and pending.

## 2023-01-29 DIAGNOSIS — J3089 Other allergic rhinitis: Secondary | ICD-10-CM | POA: Diagnosis not present

## 2023-01-30 ENCOUNTER — Other Ambulatory Visit: Payer: Medicare HMO

## 2023-02-04 ENCOUNTER — Other Ambulatory Visit (INDEPENDENT_AMBULATORY_CARE_PROVIDER_SITE_OTHER): Payer: Medicare HMO

## 2023-02-04 DIAGNOSIS — R7303 Prediabetes: Secondary | ICD-10-CM | POA: Diagnosis not present

## 2023-02-04 DIAGNOSIS — Z79899 Other long term (current) drug therapy: Secondary | ICD-10-CM | POA: Diagnosis not present

## 2023-02-04 DIAGNOSIS — E782 Mixed hyperlipidemia: Secondary | ICD-10-CM

## 2023-02-04 LAB — LIPID PANEL
Cholesterol: 155 mg/dL (ref 0–200)
HDL: 53.4 mg/dL (ref >39.00)
LDL Cholesterol: 83 mg/dL (ref 0–99)
NonHDL: 101.32
Total CHOL/HDL Ratio: 3
Triglycerides: 91 mg/dL (ref 0.0–149.0)
VLDL: 18.2 mg/dL (ref 0.0–40.0)

## 2023-02-04 LAB — HEPATIC FUNCTION PANEL
ALT: 15 U/L (ref 0–35)
AST: 19 U/L (ref 0–37)
Albumin: 4.3 g/dL (ref 3.5–5.2)
Alkaline Phosphatase: 48 U/L (ref 39–117)
Bilirubin, Direct: 0.1 mg/dL (ref 0.0–0.3)
Total Bilirubin: 0.5 mg/dL (ref 0.2–1.2)
Total Protein: 7 g/dL (ref 6.0–8.3)

## 2023-02-04 LAB — HEMOGLOBIN A1C: Hgb A1c MFr Bld: 5.8 % (ref 4.6–6.5)

## 2023-02-28 ENCOUNTER — Other Ambulatory Visit: Payer: Self-pay

## 2023-02-28 DIAGNOSIS — K219 Gastro-esophageal reflux disease without esophagitis: Secondary | ICD-10-CM

## 2023-02-28 MED ORDER — OMEPRAZOLE 20 MG PO CPDR: 20.0000 mg | DELAYED_RELEASE_CAPSULE | Freq: Every day | ORAL | 1 refills | Status: DC

## 2023-03-19 DIAGNOSIS — Z6841 Body Mass Index (BMI) 40.0 and over, adult: Secondary | ICD-10-CM | POA: Diagnosis not present

## 2023-03-19 DIAGNOSIS — Z1151 Encounter for screening for human papillomavirus (HPV): Secondary | ICD-10-CM | POA: Diagnosis not present

## 2023-03-19 DIAGNOSIS — N958 Other specified menopausal and perimenopausal disorders: Secondary | ICD-10-CM | POA: Diagnosis not present

## 2023-03-19 DIAGNOSIS — Z124 Encounter for screening for malignant neoplasm of cervix: Secondary | ICD-10-CM | POA: Diagnosis not present

## 2023-03-19 DIAGNOSIS — Z13820 Encounter for screening for osteoporosis: Secondary | ICD-10-CM | POA: Diagnosis not present

## 2023-03-26 DIAGNOSIS — N9089 Other specified noninflammatory disorders of vulva and perineum: Secondary | ICD-10-CM | POA: Diagnosis not present

## 2023-03-26 DIAGNOSIS — N907 Vulvar cyst: Secondary | ICD-10-CM | POA: Diagnosis not present

## 2023-04-08 DIAGNOSIS — Z0289 Encounter for other administrative examinations: Secondary | ICD-10-CM

## 2023-04-09 ENCOUNTER — Encounter (INDEPENDENT_AMBULATORY_CARE_PROVIDER_SITE_OTHER): Payer: BLUE CROSS/BLUE SHIELD | Admitting: Family Medicine

## 2023-04-27 ENCOUNTER — Other Ambulatory Visit: Payer: Self-pay | Admitting: Family

## 2023-04-27 DIAGNOSIS — K219 Gastro-esophageal reflux disease without esophagitis: Secondary | ICD-10-CM

## 2023-04-30 ENCOUNTER — Encounter (INDEPENDENT_AMBULATORY_CARE_PROVIDER_SITE_OTHER): Payer: Self-pay | Admitting: Family Medicine

## 2023-04-30 ENCOUNTER — Ambulatory Visit (INDEPENDENT_AMBULATORY_CARE_PROVIDER_SITE_OTHER): Payer: Medicare HMO | Admitting: Family Medicine

## 2023-04-30 VITALS — BP 118/66 | HR 70 | Temp 97.8°F | Ht 63.0 in | Wt 223.0 lb

## 2023-04-30 DIAGNOSIS — Z6839 Body mass index (BMI) 39.0-39.9, adult: Secondary | ICD-10-CM

## 2023-04-30 DIAGNOSIS — R7303 Prediabetes: Secondary | ICD-10-CM | POA: Diagnosis not present

## 2023-04-30 DIAGNOSIS — F32A Depression, unspecified: Secondary | ICD-10-CM

## 2023-04-30 DIAGNOSIS — N951 Menopausal and female climacteric states: Secondary | ICD-10-CM | POA: Diagnosis not present

## 2023-04-30 DIAGNOSIS — R0602 Shortness of breath: Secondary | ICD-10-CM | POA: Diagnosis not present

## 2023-04-30 DIAGNOSIS — R5383 Other fatigue: Secondary | ICD-10-CM | POA: Diagnosis not present

## 2023-04-30 DIAGNOSIS — E559 Vitamin D deficiency, unspecified: Secondary | ICD-10-CM | POA: Diagnosis not present

## 2023-04-30 DIAGNOSIS — E782 Mixed hyperlipidemia: Secondary | ICD-10-CM

## 2023-04-30 DIAGNOSIS — Z1331 Encounter for screening for depression: Secondary | ICD-10-CM

## 2023-04-30 NOTE — Progress Notes (Signed)
Chief Complaint:   OBESITY Mia Burnett (MR# [id number]) is a 67 y.o. female who presents for evaluation and treatment of obesity and related comorbidities. Current BMI is Body mass index is 39.5 kg/m. Mia Burnett has been struggling with her weight for many years and has been unsuccessful in either losing weight, maintaining weight loss, or reaching her healthy weight goal.  New patient.  Has a history of pre-eclampsia at 7 months with delivery 3 weeks prior to due date.  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  She is contemplating retirement. Her husband died of cancer.  Desired weight is 185 in 2011 when she did Weight Watchers. Eats out 5 times a week and fast food or takeout 3x a week.  Lives alone, is hit or miss with leftovers. Wakes up hungry in the am- has coffee with stevia 1 tsp, and 1 tbsp creamer.  On days she goes into the office she has 2 snack sized sausage biscuits and 1 banana and an apple.  Lunch is eating out at [employer] with cobb salad with grilled nuggets or chick fil a sandwich (1) and a diet drink and feels satisfied.  Dinner @6 /6:30 with a combination of foods she can find or pick up something for dinner like a grilled chicken salad or lean cuisine or leftover spaghetti with a small salad. After supper sweet of a couple cookies or bowl of ice cream or little debbie oatmeal cake.   Mia Burnett is currently in the action stage of change and ready to dedicate time achieving and maintaining a healthier weight. Mia Burnett is interested in becoming our patient and working on intensive lifestyle modifications including (but not limited to) diet and exercise for weight loss.  Mia Burnett's habits were reviewed today and are as follows: her desired weight loss is 38 lbs, she started gaining weight in 1992 after giving birth, her heaviest weight ever was 225 pounds, she has significant food cravings issues, she is frequently drinking liquids  with calories, she frequently makes poor food choices, and she struggles with emotional eating.  Depression Screen Mia Burnett's Food and Mood (modified PHQ-9) score was 9.  Subjective:   1. Other fatigue Mia Burnett admits to daytime somnolence and admits to waking up still tired. Patient has a history of symptoms of daytime fatigue and morning fatigue. Mia Burnett generally gets 7 hours of sleep per night, and states that she has nightime awakenings and generally restful sleep if she takes her Zolpidem. Snoring is present. Apneic episodes are not present. Epworth Sleepiness Score is 9.  EKG, NSR at 67 bpm.  2. SOBOE (shortness of breath on exertion) Mia Burnett notes increasing shortness of breath with exercising and seems to be worsening over time with weight gain. She notes getting out of breath sooner with activity than she used to. This has not gotten worse recently. Mia Burnett denies shortness of breath at rest or orthopnea.  3. Prediabetes Patient was diagnosed in August 2023 at least.  Patient last A1c was 5.8.  4. Mixed hyperlipidemia Patient is on rosuvastatin 10 mg.  Patient last LDL 83, prior to that 100.  5. Vitamin D deficiency Patient is on OTC vitamin D.  Patient is positive for fatigue.  6. Hot flashes due to menopause Patient is on Paxil 10 mg daily.  Assessment/Plan:   1. Other fatigue Mia Burnett does feel that her weight is causing her energy to be lower than it should be. Fatigue may be related  to obesity, depression or many other causes. Labs will be ordered, and in the meanwhile, Mia Burnett will focus on self care including making healthy food choices, increasing physical activity and focusing on stress reduction.  - EKG 12-Lead - CBC with Differential/Platelet - T3 - T4, free - TSH  2. SOBOE (shortness of breath on exertion) Mia Burnett does feel that she gets out of breath more easily that she used to when she exercises. Mia Burnett's shortness of breath appears to be obesity related and exercise  induced. She has agreed to work on weight loss and gradually increase exercise to treat her exercise induced shortness of breath. Will continue to monitor closely.  Check labs today.  - CBC with Differential/Platelet  3. Prediabetes Check labs today.  - Comprehensive metabolic panel - Insulin, random  4. Mixed hyperlipidemia Follow-up labs in 2 months.  5. Vitamin D deficiency Check labs today.  - VITAMIN D 25 Hydroxy (Vit-D Deficiency, Fractures)  6. Hot flashes due to menopause Patient to taper Paxil to 5 mg daily x 1 week then 5 mg every other day x 1 week, then revisit symptoms at next appointment.  7. Depression screening Mia Burnett had a positive depression screening. Depression is commonly associated with obesity and often results in emotional eating behaviors. We will monitor this closely and work on CBT to help improve the non-hunger eating patterns. Referral to Psychology may be required if no improvement is seen as she continues in our clinic.  8. BMI 39.0-39.9,adult  9. Class 2 severe obesity with serious comorbidity and body mass index (BMI) of 39.0 to 39.9 in adult, unspecified obesity type (HCC) Mia Burnett is currently in the action stage of change and her goal is to continue with weight loss efforts. I recommend Mia Burnett begin the structured treatment plan as follows:  She has agreed to the Category 2 Plan.  Exercise goals: No exercise has been prescribed at this time.   Behavioral modification strategies: increasing lean protein intake, meal planning and cooking strategies, keeping healthy foods in the home, and planning for success.  She was informed of the importance of frequent follow-up visits to maximize her success with intensive lifestyle modifications for her multiple health conditions. She was informed we would discuss her lab results at her next visit unless there is a critical issue that needs to be addressed sooner. Mia Burnett agreed to keep her next visit at the agreed  upon time to discuss these results.  Objective:   Blood pressure 118/66, pulse 70, temperature 97.8 F (36.6 C), height 5\' 3"  (1.6 m), weight 223 lb (101.2 kg), SpO2 98 %. Body mass index is 39.5 kg/m.  EKG: Normal sinus rhythm, rate 67 bpm.  Indirect Calorimeter completed today shows a VO2 of 225 and a REE of 1555.  Her calculated basal metabolic rate is 1610 thus her basal metabolic rate is worse than expected.  General: Cooperative, alert, well developed, in no acute distress. HEENT: Conjunctivae and lids unremarkable. Cardiovascular: Regular rhythm.  Lungs: Normal work of breathing. Neurologic: No focal deficits.   Lab Results  Component Value Date   CREATININE 0.79 04/30/2023   BUN 18 04/30/2023   NA 141 04/30/2023   K 5.2 04/30/2023   CL 102 04/30/2023   CO2 18 (L) 04/30/2023   Lab Results  Component Value Date   ALT 17 04/30/2023   AST 20 04/30/2023   ALKPHOS 65 04/30/2023   BILITOT 0.5 04/30/2023   Lab Results  Component Value Date   HGBA1C 5.8 02/04/2023  HGBA1C 6.0 07/31/2022   Lab Results  Component Value Date   INSULIN 28.1 (H) 04/30/2023   Lab Results  Component Value Date   TSH 2.200 04/30/2023   Lab Results  Component Value Date   CHOL 155 02/04/2023   HDL 53.40 02/04/2023   LDLCALC 83 02/04/2023   TRIG 91.0 02/04/2023   CHOLHDL 3 02/04/2023   Lab Results  Component Value Date   WBC 6.3 04/30/2023   HGB 13.6 04/30/2023   HCT 42.0 04/30/2023   MCV 87 04/30/2023   PLT 280 04/30/2023   No results found for: "IRON", "TIBC", "FERRITIN"  Attestation Statements:   Reviewed by clinician on day of visit: allergies, medications, problem list, medical history, surgical history, family history, social history, and previous encounter notes.  Time spent on visit including pre-visit chart review and post-visit charting and care was 45 minutes.   I, Malcolm Metro, RMA, am acting as transcriptionist for Reuben Likes, MD.  This is the  patient's first visit at [facility]. The patient's NEW PATIENT PACKET was reviewed at length. Included in the packet: current and past health history, medications, allergies, ROS, gynecologic history (women only), surgical history, family history, social history, weight history, weight loss surgery history (for those that have had weight loss surgery), nutritional evaluation, mood and food questionnaire, PHQ9, Epworth questionnaire, sleep habits questionnaire, patient life and health improvement goals questionnaire. These will all be scanned into the patient's chart under media.   During the visit, I independently reviewed the patient's EKG, bioimpedance scale results, and indirect calorimeter results. I used this information to tailor a meal plan for the patient that will help her to lose weight and will improve her obesity-related conditions going forward. I performed a medically necessary appropriate examination and/or evaluation. I discussed the assessment and treatment plan with the patient. The patient was provided an opportunity to ask questions and all were answered. The patient agreed with the plan and demonstrated an understanding of the instructions. Labs were ordered at this visit and will be reviewed at the next visit unless more critical results need to be addressed immediately. Clinical information was updated and documented in the EMR.    I have reviewed the above documentation for accuracy and completeness, and I agree with the above. - Reuben Likes, MD

## 2023-05-01 LAB — CBC WITH DIFFERENTIAL/PLATELET
Basophils Absolute: 0 10*3/uL (ref 0.0–0.2)
Basos: 1 %
EOS (ABSOLUTE): 0.2 10*3/uL (ref 0.0–0.4)
Eos: 3 %
Hematocrit: 42 % (ref 34.0–46.6)
Hemoglobin: 13.6 g/dL (ref 11.1–15.9)
Immature Grans (Abs): 0 10*3/uL (ref 0.0–0.1)
Immature Granulocytes: 0 %
Lymphocytes Absolute: 2.9 10*3/uL (ref 0.7–3.1)
Lymphs: 45 %
MCH: 28.1 pg (ref 26.6–33.0)
MCHC: 32.4 g/dL (ref 31.5–35.7)
MCV: 87 fL (ref 79–97)
Monocytes Absolute: 0.5 10*3/uL (ref 0.1–0.9)
Monocytes: 8 %
Neutrophils Absolute: 2.7 10*3/uL (ref 1.4–7.0)
Neutrophils: 43 %
Platelets: 280 10*3/uL (ref 150–450)
RBC: 4.84 x10E6/uL (ref 3.77–5.28)
RDW: 12.9 % (ref 11.7–15.4)
WBC: 6.3 10*3/uL (ref 3.4–10.8)

## 2023-05-01 LAB — COMPREHENSIVE METABOLIC PANEL
ALT: 17 IU/L (ref 0–32)
AST: 20 IU/L (ref 0–40)
Albumin/Globulin Ratio: 1.9 (ref 1.2–2.2)
Albumin: 4.6 g/dL (ref 3.9–4.9)
Alkaline Phosphatase: 65 IU/L (ref 44–121)
BUN/Creatinine Ratio: 23 (ref 12–28)
BUN: 18 mg/dL (ref 8–27)
Bilirubin Total: 0.5 mg/dL (ref 0.0–1.2)
CO2: 18 mmol/L — ABNORMAL LOW (ref 20–29)
Calcium: 9.7 mg/dL (ref 8.7–10.3)
Chloride: 102 mmol/L (ref 96–106)
Creatinine, Ser: 0.79 mg/dL (ref 0.57–1.00)
Globulin, Total: 2.4 g/dL (ref 1.5–4.5)
Glucose: 94 mg/dL (ref 70–99)
Potassium: 5.2 mmol/L (ref 3.5–5.2)
Sodium: 141 mmol/L (ref 134–144)
Total Protein: 7 g/dL (ref 6.0–8.5)
eGFR: 83 mL/min/{1.73_m2} (ref >59)

## 2023-05-01 LAB — T3: T3, Total: 137 ng/dL (ref 71–180)

## 2023-05-01 LAB — T4, FREE: Free T4: 1.04 ng/dL (ref 0.82–1.77)

## 2023-05-01 LAB — TSH: TSH: 2.2 u[IU]/mL (ref 0.450–4.500)

## 2023-05-01 LAB — INSULIN, RANDOM: INSULIN: 28.1 u[IU]/mL — ABNORMAL HIGH (ref 2.6–24.9)

## 2023-05-01 LAB — VITAMIN D 25 HYDROXY (VIT D DEFICIENCY, FRACTURES): Vit D, 25-Hydroxy: 50.9 ng/mL (ref 30.0–100.0)

## 2023-05-07 ENCOUNTER — Encounter (INDEPENDENT_AMBULATORY_CARE_PROVIDER_SITE_OTHER): Payer: Self-pay | Admitting: Family Medicine

## 2023-05-07 NOTE — Telephone Encounter (Signed)
Please advise 

## 2023-05-08 NOTE — Telephone Encounter (Signed)
Please advise 

## 2023-05-13 ENCOUNTER — Encounter (INDEPENDENT_AMBULATORY_CARE_PROVIDER_SITE_OTHER): Payer: BLUE CROSS/BLUE SHIELD | Admitting: Family Medicine

## 2023-05-14 ENCOUNTER — Ambulatory Visit (INDEPENDENT_AMBULATORY_CARE_PROVIDER_SITE_OTHER): Payer: Medicare HMO | Admitting: Family Medicine

## 2023-05-14 ENCOUNTER — Encounter (INDEPENDENT_AMBULATORY_CARE_PROVIDER_SITE_OTHER): Payer: Self-pay | Admitting: Family Medicine

## 2023-05-14 VITALS — BP 126/80 | HR 79 | Temp 97.6°F | Ht 63.0 in | Wt 217.0 lb

## 2023-05-14 DIAGNOSIS — Z6838 Body mass index (BMI) 38.0-38.9, adult: Secondary | ICD-10-CM

## 2023-05-14 DIAGNOSIS — E669 Obesity, unspecified: Secondary | ICD-10-CM | POA: Diagnosis not present

## 2023-05-14 DIAGNOSIS — R7303 Prediabetes: Secondary | ICD-10-CM

## 2023-05-14 DIAGNOSIS — N951 Menopausal and female climacteric states: Secondary | ICD-10-CM

## 2023-05-14 DIAGNOSIS — E559 Vitamin D deficiency, unspecified: Secondary | ICD-10-CM

## 2023-05-14 DIAGNOSIS — E7849 Other hyperlipidemia: Secondary | ICD-10-CM | POA: Diagnosis not present

## 2023-05-14 MED ORDER — SERTRALINE HCL 50 MG PO TABS
25.0000 mg | ORAL_TABLET | Freq: Every day | ORAL | 0 refills | Status: DC
Start: 2023-05-14 — End: 2023-05-29

## 2023-05-14 NOTE — Progress Notes (Signed)
Chief Complaint:   OBESITY Mia Burnett is here to discuss her progress with her obesity treatment plan along with follow-up of her obesity related diagnoses. Mia Burnett is on the Category 2 Plan and states she is following her eating plan approximately 60% of the time. Mia Burnett states she is doing 0 minutes 0 times per week.  Today's visit was #: 2 Starting weight: 223 lbs Starting date: 04/30/2023 Today's weight: 217 lbs Today's date: 05/14/2023 Total lbs lost to date: 6 Total lbs lost since last in-office visit: 6  Interim History: Patient has felt terribly the last few weeks- nausea, chills, abdominal pain.  Attributed this to the meal plan however she has been titrating off Paxil.  Hard to get accurate assessment of plan as patient has felt so terribly.  She initially felt she may not be able to do this plan given how poorly she felt.  She has tried to move food around to help with her nausea.  Only thing that helps is carbonated beverages.  Subjective:   1. Hot flashes due to menopause Mia Burnett is a titrating off Paxil; she started feeling poorly shortly after that.  She has had to call off work.  2. Other hyperlipidemia Mia Burnett's last LDL was 83, triglycerides 91, and HDL 53.  She is on Crestor daily.  3. Vitamin D deficiency Mia Burnett's last vitamin D level was 50.9.  She denies nausea, vomiting, or muscle weakness but notes fatigue.  She is on OTC vitamin D 2000 IU daily.  4. Prediabetes Mia Burnett's last A1c was 5.8 and insulin 28.1.  She is not on medications.  Assessment/Plan:   1. Hot flashes due to menopause Mia Burnett agreed to start sertraline 25 mg by mouth daily for 1 week, then increase to 50 mg with no refills.  - sertraline (ZOLOFT) 50 MG tablet; Take 0.5 tablets (25 mg total) by mouth daily.  Dispense: 45 tablet; Refill: 0  2. Other hyperlipidemia Mia Burnett will continue Crestor with no change in medication or dose.  3. Vitamin D deficiency Mia Burnett will continue OTC vitamin D with no  change in dose.  4. Prediabetes We will repeat labs in 3 months; Mia Burnett will continue her category 2 plan.  5. BMI 38.0-38.9,adult  6. Obesity with starting BMI of 39.6 Mia Burnett is currently in the action stage of change. As such, her goal is to continue with weight loss efforts. She has agreed to the Category 2 Plan.   Exercise goals: All adults should avoid inactivity. Some physical activity is better than none, and adults who participate in any amount of physical activity gain some health benefits.  Behavioral modification strategies: increasing lean protein intake, meal planning and cooking strategies, keeping healthy foods in the home, and planning for success.  Mia Burnett has agreed to follow-up with our clinic in 2 weeks. She was informed of the importance of frequent follow-up visits to maximize her success with intensive lifestyle modifications for her multiple health conditions.   Objective:   Blood pressure 126/80, pulse 79, temperature 97.6 F (36.4 C), height 5\' 3"  (1.6 m), weight 217 lb (98.4 kg), SpO2 99 %. Body mass index is 38.44 kg/m.  General: Cooperative, alert, well developed, in no acute distress. HEENT: Conjunctivae and lids unremarkable. Cardiovascular: Regular rhythm.  Lungs: Normal work of breathing. Neurologic: No focal deficits.   Lab Results  Component Value Date   CREATININE 0.79 04/30/2023   BUN 18 04/30/2023   NA 141 04/30/2023   K 5.2 04/30/2023   CL 102  04/30/2023   CO2 18 (L) 04/30/2023   Lab Results  Component Value Date   ALT 17 04/30/2023   AST 20 04/30/2023   ALKPHOS 65 04/30/2023   BILITOT 0.5 04/30/2023   Lab Results  Component Value Date   HGBA1C 5.8 02/04/2023   HGBA1C 6.0 07/31/2022   Lab Results  Component Value Date   INSULIN 28.1 (H) 04/30/2023   Lab Results  Component Value Date   TSH 2.200 04/30/2023   Lab Results  Component Value Date   CHOL 155 02/04/2023   HDL 53.40 02/04/2023   LDLCALC 83 02/04/2023   TRIG  91.0 02/04/2023   CHOLHDL 3 02/04/2023   Lab Results  Component Value Date   VD25OH 50.9 04/30/2023   Lab Results  Component Value Date   WBC 6.3 04/30/2023   HGB 13.6 04/30/2023   HCT 42.0 04/30/2023   MCV 87 04/30/2023   PLT 280 04/30/2023   No results found for: "IRON", "TIBC", "FERRITIN"  Attestation Statements:   Reviewed by clinician on day of visit: allergies, medications, problem list, medical history, surgical history, family history, social history, and previous encounter notes.  Time spent on visit including pre-visit chart review and post-visit care and charting was 45 minutes.   I, Burt Knack, am acting as transcriptionist for Reuben Likes, MD.  I have reviewed the above documentation for accuracy and completeness, and I agree with the above. - Reuben Likes, MD

## 2023-05-29 ENCOUNTER — Ambulatory Visit (INDEPENDENT_AMBULATORY_CARE_PROVIDER_SITE_OTHER): Payer: Medicare HMO | Admitting: Family Medicine

## 2023-05-29 ENCOUNTER — Encounter (INDEPENDENT_AMBULATORY_CARE_PROVIDER_SITE_OTHER): Payer: Self-pay | Admitting: Family Medicine

## 2023-05-29 VITALS — BP 112/73 | HR 70 | Temp 97.7°F | Ht 63.0 in | Wt 217.0 lb

## 2023-05-29 DIAGNOSIS — R7303 Prediabetes: Secondary | ICD-10-CM

## 2023-05-29 DIAGNOSIS — Z6838 Body mass index (BMI) 38.0-38.9, adult: Secondary | ICD-10-CM

## 2023-05-29 DIAGNOSIS — N951 Menopausal and female climacteric states: Secondary | ICD-10-CM | POA: Diagnosis not present

## 2023-05-29 DIAGNOSIS — E669 Obesity, unspecified: Secondary | ICD-10-CM | POA: Diagnosis not present

## 2023-05-29 MED ORDER — SERTRALINE HCL 50 MG PO TABS
50.0000 mg | ORAL_TABLET | Freq: Every day | ORAL | 0 refills | Status: DC
Start: 2023-05-29 — End: 2023-08-13

## 2023-05-29 NOTE — Progress Notes (Signed)
Chief Complaint:   OBESITY Mia Burnett is here to discuss her progress with her obesity treatment plan along with follow-up of her obesity related diagnoses. Mia Burnett is on the Category 2 Plan and states she is following her eating plan approximately 80-85% of the time. Mia Burnett states she is walking 30-45 minutes 3 times per week.  Today's visit was #: 3 Starting weight: 223 LBS Starting date: 04/30/2023 Today's weight: 217 LBS Today's date: 05/29/2023 Total lbs lost to date: 6 LBS Total lbs lost since last in-office visit: 0  Interim History: Patient has been following Category 2 for the most part. Off plan has been eating at [employer] and having a baked potato.  She also split a dessert with her daughter.  She also isn't finishing the full portion of meals.  Breakfast is still difficult for her; not sure what she wants to eat.  She wants to explore other options for breakfast.  She is feeling much better than she was previously. Next few weeks she is going out to eat in honor of her husband's death.    Subjective:   1. Hot flashes due to menopause Patient is on sertraline daily now with significant improvement in symptoms from last appointment.  2. Prediabetes Patient last A1c 5.8, insulin 28.1.  Patient is not on any medications.  Assessment/Plan:   1. Hot flashes due to menopause Refill- sertraline (ZOLOFT) 50 MG tablet; Take 1 tablet (50 mg total) by mouth daily.  Dispense: 90 tablet; Refill: 0  2. Prediabetes Follow-up labs in September.  3. BMI 38.0-38.9,adult  4. Obesity with starting BMI of 39.6 Mia Burnett is currently in the action stage of change. As such, her goal is to continue with weight loss efforts. She has agreed to the Category 2 Plan and keeping a food journal and adhering to recommended goals of 200-250 calories and 20+ protein at breakfast..   Exercise goals: All adults should avoid inactivity. Some physical activity is better than none, and adults who participate in  any amount of physical activity gain some health benefits.  Behavioral modification strategies: increasing lean protein intake, meal planning and cooking strategies, keeping healthy foods in the home, and planning for success.  Mia Burnett has agreed to follow-up with our clinic in 3 weeks. She was informed of the importance of frequent follow-up visits to maximize her success with intensive lifestyle modifications for her multiple health conditions.   Objective:   Blood pressure 112/73, pulse 70, temperature 97.7 F (36.5 C), height 5\' 3"  (1.6 m), weight 217 lb (98.4 kg), SpO2 98 %. Body mass index is 38.44 kg/m.  General: Cooperative, alert, well developed, in no acute distress. HEENT: Conjunctivae and lids unremarkable. Cardiovascular: Regular rhythm.  Lungs: Normal work of breathing. Neurologic: No focal deficits.   Lab Results  Component Value Date   CREATININE 0.79 04/30/2023   BUN 18 04/30/2023   NA 141 04/30/2023   K 5.2 04/30/2023   CL 102 04/30/2023   CO2 18 (L) 04/30/2023   Lab Results  Component Value Date   ALT 17 04/30/2023   AST 20 04/30/2023   ALKPHOS 65 04/30/2023   BILITOT 0.5 04/30/2023   Lab Results  Component Value Date   HGBA1C 5.8 02/04/2023   HGBA1C 6.0 07/31/2022   Lab Results  Component Value Date   INSULIN 28.1 (H) 04/30/2023   Lab Results  Component Value Date   TSH 2.200 04/30/2023   Lab Results  Component Value Date   CHOL 155  02/04/2023   HDL 53.40 02/04/2023   LDLCALC 83 02/04/2023   TRIG 91.0 02/04/2023   CHOLHDL 3 02/04/2023   Lab Results  Component Value Date   VD25OH 50.9 04/30/2023   Lab Results  Component Value Date   WBC 6.3 04/30/2023   HGB 13.6 04/30/2023   HCT 42.0 04/30/2023   MCV 87 04/30/2023   PLT 280 04/30/2023   No results found for: "IRON", "TIBC", "FERRITIN"  Attestation Statements:   Reviewed by clinician on day of visit: allergies, medications, problem list, medical history, surgical history, family  history, social history, and previous encounter notes.  I, Malcolm Metro, RMA, am acting as transcriptionist for Reuben Likes, MD.  I have reviewed the above documentation for accuracy and completeness, and I agree with the above. - Reuben Likes, MD

## 2023-06-22 ENCOUNTER — Other Ambulatory Visit: Payer: Self-pay | Admitting: Family

## 2023-06-22 ENCOUNTER — Encounter: Payer: Self-pay | Admitting: Family

## 2023-06-22 DIAGNOSIS — E782 Mixed hyperlipidemia: Secondary | ICD-10-CM

## 2023-06-23 ENCOUNTER — Ambulatory Visit (INDEPENDENT_AMBULATORY_CARE_PROVIDER_SITE_OTHER): Payer: Medicare HMO | Admitting: Family Medicine

## 2023-06-23 ENCOUNTER — Encounter (INDEPENDENT_AMBULATORY_CARE_PROVIDER_SITE_OTHER): Payer: Self-pay | Admitting: Family Medicine

## 2023-06-23 VITALS — BP 109/71 | HR 76 | Temp 98.1°F | Ht 63.0 in | Wt 213.0 lb

## 2023-06-23 DIAGNOSIS — Z6837 Body mass index (BMI) 37.0-37.9, adult: Secondary | ICD-10-CM | POA: Diagnosis not present

## 2023-06-23 DIAGNOSIS — E7849 Other hyperlipidemia: Secondary | ICD-10-CM

## 2023-06-23 DIAGNOSIS — E669 Obesity, unspecified: Secondary | ICD-10-CM | POA: Diagnosis not present

## 2023-06-23 DIAGNOSIS — R7303 Prediabetes: Secondary | ICD-10-CM

## 2023-06-23 MED ORDER — ROSUVASTATIN CALCIUM 10 MG PO TABS
10.0000 mg | ORAL_TABLET | Freq: Every day | ORAL | 3 refills | Status: DC
Start: 2023-06-23 — End: 2024-04-12

## 2023-06-23 NOTE — Progress Notes (Unsigned)
Chief Complaint:   OBESITY Mia Burnett is here to discuss her progress with her obesity treatment plan along with follow-up of her obesity related diagnoses. Mia Burnett is on the Category 2 Plan and keeping a food journal and adhering to recommended goals of 200-250 calories and 20+ grams of protein at breakfast and states she is following her eating plan approximately 80% of the time. Mia Burnett states she is walking 1 mile, and swimming for 30 minutes 2-3 times per week.  Today's visit was #: 4 Starting weight: 223 lbs Starting date: 04/30/2023 Today's weight: 213 lbs Today's date: 06/23/2023 Total lbs lost to date: 10 Total lbs lost since last in-office visit: 4  Interim History: Patient went to a concert in [place] with 3 acts and it was a long time to sit in the seats.  She didn't get home until 4am. She has been eating some fruits that aren't on her list like cherries or watermelon.  She has eaten apples when she is hungry so she is going more to cherries. Some days she is still hungry even after eating all the food on the plan.  She is doing protein substitutions during her meals.  She is making more healthy choices even when eating out. Not sure what she is doing for July 4th.   Subjective:   1. Prediabetes Patient's recent A1c was 5.8 and insulin 21.1.  She is not on medications.  2. Other hyperlipidemia Patient is on Crestor daily.  Last LDL was of 83, HDL 53.4, and triglycerides 91.0.  Assessment/Plan:   1. Prediabetes We will repeat fasting labs in late August.  2. Other hyperlipidemia Patient will continue her category plan but move up to category 3.  We will repeat fasting labs in 2 months.  3. BMI 37.0-37.9, adult  4. Obesity with starting BMI of 39.6 Mia Burnett is currently in the action stage of change. As such, her goal is to continue with weight loss efforts. She has agreed to the Category 3 Plan with 8 oz of protein.   Exercise goals: All adults should avoid inactivity. Some  physical activity is better than none, and adults who participate in any amount of physical activity gain some health benefits.  Behavioral modification strategies: increasing lean protein intake, meal planning and cooking strategies, keeping healthy foods in the home, and planning for success.  Mia Burnett has agreed to follow-up with our clinic in 3 weeks. She was informed of the importance of frequent follow-up visits to maximize her success with intensive lifestyle modifications for her multiple health conditions.   Objective:   Blood pressure 109/71, pulse 76, temperature 98.1 F (36.7 C), height 5\' 3"  (1.6 m), weight 213 lb (96.6 kg), SpO2 98 %. Body mass index is 37.73 kg/m.  General: Cooperative, alert, well developed, in no acute distress. HEENT: Conjunctivae and lids unremarkable. Cardiovascular: Regular rhythm.  Lungs: Normal work of breathing. Neurologic: No focal deficits.   Lab Results  Component Value Date   CREATININE 0.79 04/30/2023   BUN 18 04/30/2023   NA 141 04/30/2023   K 5.2 04/30/2023   CL 102 04/30/2023   CO2 18 (L) 04/30/2023   Lab Results  Component Value Date   ALT 17 04/30/2023   AST 20 04/30/2023   ALKPHOS 65 04/30/2023   BILITOT 0.5 04/30/2023   Lab Results  Component Value Date   HGBA1C 5.8 02/04/2023   HGBA1C 6.0 07/31/2022   Lab Results  Component Value Date   INSULIN 28.1 (H) 04/30/2023  Lab Results  Component Value Date   TSH 2.200 04/30/2023   Lab Results  Component Value Date   CHOL 155 02/04/2023   HDL 53.40 02/04/2023   LDLCALC 83 02/04/2023   TRIG 91.0 02/04/2023   CHOLHDL 3 02/04/2023   Lab Results  Component Value Date   VD25OH 50.9 04/30/2023   Lab Results  Component Value Date   WBC 6.3 04/30/2023   HGB 13.6 04/30/2023   HCT 42.0 04/30/2023   MCV 87 04/30/2023   PLT 280 04/30/2023   No results found for: "IRON", "TIBC", "FERRITIN"  Attestation Statements:   Reviewed by clinician on day of visit: allergies,  medications, problem list, medical history, surgical history, family history, social history, and previous encounter notes.   I, Burt Knack, am acting as transcriptionist for Reuben Likes, MD.  I have reviewed the above documentation for accuracy and completeness, and I agree with the above. - Reuben Likes, MD

## 2023-07-23 ENCOUNTER — Ambulatory Visit (INDEPENDENT_AMBULATORY_CARE_PROVIDER_SITE_OTHER): Payer: Medicare HMO | Admitting: Family Medicine

## 2023-07-29 ENCOUNTER — Encounter: Payer: Self-pay | Admitting: Family

## 2023-07-29 ENCOUNTER — Ambulatory Visit (INDEPENDENT_AMBULATORY_CARE_PROVIDER_SITE_OTHER): Payer: Medicare HMO | Admitting: Family

## 2023-07-29 VITALS — BP 120/80 | HR 88 | Temp 97.6°F | Ht 63.0 in | Wt 214.0 lb

## 2023-07-29 DIAGNOSIS — R7303 Prediabetes: Secondary | ICD-10-CM

## 2023-07-29 DIAGNOSIS — Z6837 Body mass index (BMI) 37.0-37.9, adult: Secondary | ICD-10-CM

## 2023-07-29 DIAGNOSIS — J40 Bronchitis, not specified as acute or chronic: Secondary | ICD-10-CM | POA: Insufficient documentation

## 2023-07-29 DIAGNOSIS — J029 Acute pharyngitis, unspecified: Secondary | ICD-10-CM

## 2023-07-29 DIAGNOSIS — Z6841 Body Mass Index (BMI) 40.0 and over, adult: Secondary | ICD-10-CM | POA: Diagnosis not present

## 2023-07-29 DIAGNOSIS — J020 Streptococcal pharyngitis: Secondary | ICD-10-CM

## 2023-07-29 DIAGNOSIS — E6609 Other obesity due to excess calories: Secondary | ICD-10-CM | POA: Diagnosis not present

## 2023-07-29 DIAGNOSIS — E66812 Obesity, class 2: Secondary | ICD-10-CM | POA: Insufficient documentation

## 2023-07-29 LAB — POCT GLYCOSYLATED HEMOGLOBIN (HGB A1C): Hemoglobin A1C: 5.6 % (ref 4.0–5.6)

## 2023-07-29 MED ORDER — AMOXICILLIN-POT CLAVULANATE 875-125 MG PO TABS: 1.0000 | ORAL_TABLET | Freq: Two times a day (BID) | ORAL | 0 refills | Status: DC

## 2023-07-29 MED ORDER — CEFDINIR 300 MG PO CAPS
300.0000 mg | ORAL_CAPSULE | Freq: Two times a day (BID) | ORAL | 0 refills | Status: AC
Start: 2023-07-29 — End: 2023-08-08

## 2023-07-29 NOTE — Patient Instructions (Signed)
------------------------------------    You were found to be strep positive,  Take antibiotics that have been sent to the pharmacy.  Change your toothbrush after 24 hours on the antibiotics.  Gargle with warm salt water as needed for sore throat.   ------------------------------------  

## 2023-07-29 NOTE — Assessment & Plan Note (Signed)
Take antibiotic as prescribed. Increase oral fluids. Pt to f/u if sx worsen and or fail to improve in 2-3 days.  

## 2023-07-29 NOTE — Assessment & Plan Note (Signed)
Check A1c today  Pt advised of the following: Work on a diabetic diet, try to incorporate exercise at least 20-30 a day for 3 days a week or more.

## 2023-07-29 NOTE — Assessment & Plan Note (Signed)
Strep tested positive in office.  Rx cefidinir 300 mg po bid  Choosing this over augmentin as pt with stomach pains with augmentin  Also choosing as possible sinusitis with strep   Ibuprofen/tyelnol prn sore throat/fever Pt told to F/u if no improvement in the next 2-3 days.

## 2023-07-29 NOTE — Assessment & Plan Note (Signed)
Pt advised to work on diet and exercise as tolerated Continue with weight and wellness

## 2023-07-29 NOTE — Progress Notes (Signed)
Established Patient Office Visit  Subjective:      CC:  Chief Complaint  Patient presents with   Fatigue    Had covid 3 weeks ago still having a lot of fatigue and cough at times.     HPI: Mia Burnett is a 67 y.o. female presenting on 07/29/2023 for Fatigue (Had covid 3 weeks ago still having a lot of fatigue and cough at times. ) . Covid: three weeks ago, still lingering fatigue and cough. Did not have any medications. Still with residual cough, slight phlegm that is yellow. No chest congestion. Still with hoarseness, still with some headache also with some sinus pressure around cheeks and also sinus headache. Also with increased fatigue, 'hard to go' went back to work for the first day. No fever. No sob. Taking otc delsym which helps some at night, taking tylenol and advil as needed for headache. Still on singulair 10 mg once daily. Still using flonase nose spray.   Obesity: last visit with weight and wellness 06/23/23.  Wt Readings from Last 3 Encounters:  07/29/23 214 lb (97.1 kg)  06/23/23 213 lb (96.6 kg)  05/29/23 217 lb (98.4 kg)   Prediabetes: improving.  Lab Results  Component Value Date   HGBA1C 5.6 07/29/2023   Lab Results  Component Value Date   HGBA1C 5.6 07/29/2023       Social history:  Relevant past medical, surgical, family and social history reviewed and updated as indicated. Interim medical history since our last visit reviewed.  Allergies and medications reviewed and updated.  DATA REVIEWED: CHART IN EPIC     ROS: Negative unless specifically indicated above in HPI.    Current Outpatient Medications:    albuterol (VENTOLIN HFA) 108 (90 Base) MCG/ACT inhaler, Inhale into the lungs., Disp: , Rfl:    Calcium Citrate (CITRACAL PO), Take 800 mg by mouth., Disp: , Rfl:    cefdinir (OMNICEF) 300 MG capsule, Take 1 capsule (300 mg total) by mouth 2 (two) times daily for 10 days., Disp: 20 capsule, Rfl: 0   Cholecalciferol (VITAMIN D3) 50 MCG  (2000 UT) TABS, Take by mouth., Disp: , Rfl:    Coenzyme Q10 (COQ10) 200 MG CAPS, Take by mouth., Disp: , Rfl:    Daridorexant HCl (QUVIVIQ) 25 MG TABS, Take 1 tablet as needed by oral route at bedtime for 30 days., Disp: , Rfl:    EPINEPHrine 0.3 mg/0.3 mL IJ SOAJ injection, AUTO-INJECTOR INTO OUTER THIGH INJECTION AS NEEDED FOR ANAPHYLAXIS., Disp: , Rfl:    fluticasone (FLONASE ALLERGY RELIEF) 50 MCG/ACT nasal spray, 2 sprays, Disp: , Rfl:    montelukast (SINGULAIR) 10 MG tablet, , Disp: , Rfl:    Multiple Vitamin (MULTIVITAMIN) capsule, Take 1 capsule by mouth daily., Disp: , Rfl:    omeprazole (PRILOSEC) 20 MG capsule, TAKE 1 CAPSULE BY MOUTH EVERY DAY, Disp: 90 capsule, Rfl: 1   PRESCRIPTION MEDICATION, Allergy injections, Disp: , Rfl:    rosuvastatin (CRESTOR) 10 MG tablet, Take 1 tablet (10 mg total) by mouth at bedtime., Disp: 90 tablet, Rfl: 3   sertraline (ZOLOFT) 50 MG tablet, Take 1 tablet (50 mg total) by mouth daily., Disp: 90 tablet, Rfl: 0   zolpidem (AMBIEN) 10 MG tablet, , Disp: , Rfl:       Objective:    BP 120/80   Pulse 88   Temp 97.6 F (36.4 C) (Temporal)   Ht 5\' 3"  (1.6 m)   Wt 214 lb (97.1 kg)  SpO2 100%   BMI 37.91 kg/m   Wt Readings from Last 3 Encounters:  07/29/23 214 lb (97.1 kg)  06/23/23 213 lb (96.6 kg)  05/29/23 217 lb (98.4 kg)    Physical Exam Constitutional:      General: She is not in acute distress.    Appearance: Normal appearance. She is normal weight. She is not ill-appearing, toxic-appearing or diaphoretic.  HENT:     Head: Normocephalic.     Right Ear: Tympanic membrane normal.     Left Ear: Tympanic membrane normal.     Nose:     Right Sinus: Maxillary sinus tenderness and frontal sinus tenderness present.     Left Sinus: Maxillary sinus tenderness and frontal sinus tenderness present.     Mouth/Throat:     Mouth: Mucous membranes are dry.     Pharynx: Posterior oropharyngeal erythema present. No oropharyngeal exudate.  Eyes:      Extraocular Movements: Extraocular movements intact.     Pupils: Pupils are equal, round, and reactive to light.  Cardiovascular:     Rate and Rhythm: Normal rate and regular rhythm.     Pulses: Normal pulses.     Heart sounds: Normal heart sounds.  Pulmonary:     Effort: Pulmonary effort is normal.     Breath sounds: Normal breath sounds.  Musculoskeletal:     Cervical back: Normal range of motion.  Lymphadenopathy:     Cervical: Cervical adenopathy present.     Right cervical: Superficial cervical adenopathy present.     Left cervical: Superficial cervical adenopathy present.  Neurological:     General: No focal deficit present.     Mental Status: She is alert and oriented to person, place, and time. Mental status is at baseline.  Psychiatric:        Mood and Affect: Mood normal.        Behavior: Behavior normal.        Thought Content: Thought content normal.        Judgment: Judgment normal.           Assessment & Plan:  Class 3 severe obesity due to excess calories without serious comorbidity with body mass index (BMI) of 40.0 to 44.9 in adult (HCC) -     POCT glycosylated hemoglobin (Hb A1C)  Prediabetes Assessment & Plan: Check A1c today  Pt advised of the following: Work on a diabetic diet, try to incorporate exercise at least 20-30 a day for 3 days a week or more.    Orders: -     POCT glycosylated hemoglobin (Hb A1C)  Sore throat -     POCT rapid strep A  Class 2 obesity due to excess calories without serious comorbidity with body mass index (BMI) of 37.0 to 37.9 in adult Assessment & Plan: Pt advised to work on diet and exercise as tolerated Continue with weight and wellness   Strep pharyngitis Assessment & Plan: Strep tested positive in office.  Rx cefidinir 300 mg po bid  Choosing this over augmentin as pt with stomach pains with augmentin  Also choosing as possible sinusitis with strep   Ibuprofen/tyelnol prn sore throat/fever Pt told to  F/u if no improvement in the next 2-3 days.   Orders: -     Cefdinir; Take 1 capsule (300 mg total) by mouth 2 (two) times daily for 10 days.  Dispense: 20 capsule; Refill: 0     Return in about 6 months (around 01/29/2024) for f/u CPE.  Aquanetta Schwarz  Iyad Deroo, MSN, APRN, FNP-C [facility]

## 2023-08-13 ENCOUNTER — Encounter (INDEPENDENT_AMBULATORY_CARE_PROVIDER_SITE_OTHER): Payer: Self-pay | Admitting: Family Medicine

## 2023-08-13 ENCOUNTER — Ambulatory Visit (INDEPENDENT_AMBULATORY_CARE_PROVIDER_SITE_OTHER): Payer: Medicare HMO | Admitting: Family Medicine

## 2023-08-13 VITALS — BP 128/79 | HR 69 | Temp 97.7°F | Ht 63.0 in | Wt 209.0 lb

## 2023-08-13 DIAGNOSIS — R058 Other specified cough: Secondary | ICD-10-CM | POA: Diagnosis not present

## 2023-08-13 DIAGNOSIS — E669 Obesity, unspecified: Secondary | ICD-10-CM | POA: Diagnosis not present

## 2023-08-13 DIAGNOSIS — Z6837 Body mass index (BMI) 37.0-37.9, adult: Secondary | ICD-10-CM | POA: Diagnosis not present

## 2023-08-13 DIAGNOSIS — N951 Menopausal and female climacteric states: Secondary | ICD-10-CM

## 2023-08-13 MED ORDER — SERTRALINE HCL 50 MG PO TABS
50.0000 mg | ORAL_TABLET | Freq: Every day | ORAL | 0 refills | Status: DC
Start: 2023-08-13 — End: 2023-11-03

## 2023-08-13 NOTE — Progress Notes (Signed)
Chief Complaint:   OBESITY Mia Burnett is here to discuss her progress with her obesity treatment plan along with follow-up of her obesity related diagnoses. Mia Burnett is on the Category 3 Plan with 8 oz of protein and states she is following her eating plan approximately 25% of the time. Mia Burnett states she is doing some walking.    Today's visit was #: 5 Starting weight: 223 lbs Starting date: 04/30/2023 Today's weight: 209 lbs Today's date: 08/13/2023 Total lbs lost to date: 14 Total lbs lost since last in-office visit: 4  Interim History: Since last appointment she had COVID, strep and a sinus infection.  While she was at PCP getting worked up she got her A1c tested and she is no longer prediabetic.  She has plans to go to [place] in October. She was not as mindful of food choices in the last few weeks due to illness.  She is hoping to get back on plan.  She has gotten more on plan in the last week.  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  Subjective:   1. Hot flashes due to menopause Patient is on Zoloft with good control of her symptoms.  She denies suicidal or homicidal ideations.  2. Nocturnal cough Patient is taking Singulair and Flonase.  She notes significant cough after COVID, strep, and sinus infection.  Assessment/Plan:   1. Hot flashes due to menopause Patient will continue Zoloft 50 mg once daily, we will refill for 90 days.  - sertraline (ZOLOFT) 50 MG tablet; Take 1 tablet (50 mg total) by mouth daily.  Dispense: 90 tablet; Refill: 0  2. Nocturnal cough Patient was told to start Xyzal daily.  3. BMI 37.0-37.9, adult  4. Obesity with starting BMI of 39.6 Mia Burnett is currently in the action stage of change. As such, her goal is to continue with weight loss efforts. She has agreed to the Category 3 Plan with 8 oz of protein.   Exercise goals: All adults should avoid inactivity. Some physical  activity is better than none, and adults who participate in any amount of physical activity gain some health benefits.  Patient is to plans for physical activity for her next appointment.  Behavioral modification strategies: increasing lean protein intake, meal planning and cooking strategies, keeping healthy foods in the home, and planning for success.  Mia Burnett has agreed to follow-up with our clinic in 6 weeks. She was informed of the importance of frequent follow-up visits to maximize her success with intensive lifestyle modifications for her multiple health conditions.   Objective:   Blood pressure 128/79, pulse 69, temperature 97.7 F (36.5 C), height 5\' 3"  (1.6 m), weight 209 lb (94.8 kg), SpO2 97%. Body mass index is 37.02 kg/m.  General: Cooperative, alert, well developed, in no acute distress. HEENT: Conjunctivae and lids unremarkable. Cardiovascular: Regular rhythm.  Lungs: Normal work of breathing. Neurologic: No focal deficits.   Lab Results  Component Value Date   CREATININE 0.79 04/30/2023   BUN 18 04/30/2023   NA 141 04/30/2023   K 5.2 04/30/2023   CL 102 04/30/2023   CO2 18 (L) 04/30/2023   Lab Results  Component Value Date   ALT 17 04/30/2023   AST 20 04/30/2023   ALKPHOS 65 04/30/2023   BILITOT 0.5 04/30/2023   Lab Results  Component Value Date   HGBA1C 5.6 07/29/2023   HGBA1C 5.8 02/04/2023  HGBA1C 6.0 07/31/2022   Lab Results  Component Value Date   INSULIN 28.1 (H) 04/30/2023   Lab Results  Component Value Date   TSH 2.200 04/30/2023   Lab Results  Component Value Date   CHOL 155 02/04/2023   HDL 53.40 02/04/2023   LDLCALC 83 02/04/2023   TRIG 91.0 02/04/2023   CHOLHDL 3 02/04/2023   Lab Results  Component Value Date   VD25OH 50.9 04/30/2023   Lab Results  Component Value Date   WBC 6.3 04/30/2023   HGB 13.6 04/30/2023   HCT 42.0 04/30/2023   MCV 87 04/30/2023   PLT 280 04/30/2023   No results found for: "IRON", "TIBC",  "FERRITIN"  Attestation Statements:   Reviewed by clinician on day of visit: allergies, medications, problem list, medical history, surgical history, family history, social history, and previous encounter notes.   I, Burt Knack, am acting as transcriptionist for Reuben Likes, MD.  I have reviewed the above documentation for accuracy and completeness, and I agree with the above. - Reuben Likes, MD

## 2023-08-18 DIAGNOSIS — Z1231 Encounter for screening mammogram for malignant neoplasm of breast: Secondary | ICD-10-CM | POA: Diagnosis not present

## 2023-09-02 ENCOUNTER — Other Ambulatory Visit: Payer: Self-pay | Admitting: Family

## 2023-09-02 DIAGNOSIS — K219 Gastro-esophageal reflux disease without esophagitis: Secondary | ICD-10-CM

## 2023-09-25 DIAGNOSIS — J3089 Other allergic rhinitis: Secondary | ICD-10-CM | POA: Diagnosis not present

## 2023-09-25 DIAGNOSIS — J4520 Mild intermittent asthma, uncomplicated: Secondary | ICD-10-CM | POA: Diagnosis not present

## 2023-09-25 DIAGNOSIS — J3081 Allergic rhinitis due to animal (cat) (dog) hair and dander: Secondary | ICD-10-CM | POA: Diagnosis not present

## 2023-09-25 DIAGNOSIS — J301 Allergic rhinitis due to pollen: Secondary | ICD-10-CM | POA: Diagnosis not present

## 2023-09-29 ENCOUNTER — Ambulatory Visit (INDEPENDENT_AMBULATORY_CARE_PROVIDER_SITE_OTHER): Payer: Medicare HMO | Admitting: Family Medicine

## 2023-09-29 ENCOUNTER — Encounter (INDEPENDENT_AMBULATORY_CARE_PROVIDER_SITE_OTHER): Payer: Self-pay | Admitting: Family Medicine

## 2023-09-29 VITALS — BP 116/76 | HR 70 | Temp 97.8°F | Ht 63.0 in | Wt 207.0 lb

## 2023-09-29 DIAGNOSIS — Z6836 Body mass index (BMI) 36.0-36.9, adult: Secondary | ICD-10-CM

## 2023-09-29 DIAGNOSIS — E7849 Other hyperlipidemia: Secondary | ICD-10-CM

## 2023-09-29 DIAGNOSIS — R7303 Prediabetes: Secondary | ICD-10-CM | POA: Diagnosis not present

## 2023-09-29 DIAGNOSIS — E669 Obesity, unspecified: Secondary | ICD-10-CM | POA: Diagnosis not present

## 2023-09-29 NOTE — Progress Notes (Signed)
Chief Complaint:   OBESITY Mia Burnett is here to discuss her progress with her obesity treatment plan along with follow-up of her obesity related diagnoses. Mia Burnett is on the Category 3 Plan with 8 oz and states she is following her eating plan approximately 75% of the time. Mia Burnett states she is doing Zumba for 60 minutes 1 time per week.  Today's visit was #: 6 Starting weight: 223 lbs Starting date: 04/30/2023 Today's weight: 207 lbs Today's date: 09/29/2023 Total lbs lost to date: 16 Total lbs lost since last in-office visit: 2  Interim History: Since last appointment she went to the beach and enjoyed herself.  She has had some issues with her dog who is 37 years old.  She joined a zumba class at work and her knee is bothering her.  She has been going to the gym and doing the elliptical for about 30 minutes.  She leaves in 5 days for [place].  Since last appointment she has followed the meal plan around 75% of the time.  She had a glass of wine 1 night and 1 ice cream.  She is feeling her weight loss is somewhat slow.  She is getting all her protein in.  Subjective:   1. Prediabetes Patient's last A1c was 5.6 at the end of May and her insulin was elevated.  She is not on medications.  2. Other hyperlipidemia Patient is on Crestor daily.  Her last LDL was controlled.  She denies myalgias or transaminitis.  Assessment/Plan:   1. Prediabetes Patient will continue with her meal plan, but she will decrease to Category 2 with an additional 100 calories.  2. Other hyperlipidemia We will repeat labs at patient's next appointment in 4 weeks.  3. BMI 36.0-36.9,adult  4. Obesity with starting BMI of 39.6 Mia Burnett is currently in the action stage of change. As such, her goal is to continue with weight loss efforts. She has agreed to the Category 2 Plan + 100 calories with 8 oz of protein.   Exercise goals: All adults should avoid inactivity. Some physical activity is better than none, and adults  who participate in any amount of physical activity gain some health benefits.  Patient was encouraged to at the recumbent bike to help with knee pain.  Behavioral modification strategies: increasing lean protein intake, meal planning and cooking strategies, keeping healthy foods in the home, and planning for success.  Mia Burnett has agreed to follow-up with our clinic in 4 weeks. She was informed of the importance of frequent follow-up visits to maximize her success with intensive lifestyle modifications for her multiple health conditions.   Objective:   Blood pressure 116/76, pulse 70, temperature 97.8 F (36.6 C), height 5\' 3"  (1.6 m), weight 207 lb (93.9 kg), SpO2 98%. Body mass index is 36.67 kg/m.  General: Cooperative, alert, well developed, in no acute distress. HEENT: Conjunctivae and lids unremarkable. Cardiovascular: Regular rhythm.  Lungs: Normal work of breathing. Neurologic: No focal deficits.   Lab Results  Component Value Date   CREATININE 0.79 04/30/2023   BUN 18 04/30/2023   NA 141 04/30/2023   K 5.2 04/30/2023   CL 102 04/30/2023   CO2 18 (L) 04/30/2023   Lab Results  Component Value Date   ALT 17 04/30/2023   AST 20 04/30/2023   ALKPHOS 65 04/30/2023   BILITOT 0.5 04/30/2023   Lab Results  Component Value Date   HGBA1C 5.6 07/29/2023   HGBA1C 5.8 02/04/2023   HGBA1C 6.0 07/31/2022  Lab Results  Component Value Date   INSULIN 28.1 (H) 04/30/2023   Lab Results  Component Value Date   TSH 2.200 04/30/2023   Lab Results  Component Value Date   CHOL 155 02/04/2023   HDL 53.40 02/04/2023   LDLCALC 83 02/04/2023   TRIG 91.0 02/04/2023   CHOLHDL 3 02/04/2023   Lab Results  Component Value Date   VD25OH 50.9 04/30/2023   Lab Results  Component Value Date   WBC 6.3 04/30/2023   HGB 13.6 04/30/2023   HCT 42.0 04/30/2023   MCV 87 04/30/2023   PLT 280 04/30/2023   No results found for: "IRON", "TIBC", "FERRITIN"  Attestation Statements:    Reviewed by clinician on day of visit: allergies, medications, problem list, medical history, surgical history, family history, social history, and previous encounter notes.   I, Burt Knack, am acting as transcriptionist for Reuben Likes, MD.  I have reviewed the above documentation for accuracy and completeness, and I agree with the above. - Reuben Likes, MD

## 2023-11-03 ENCOUNTER — Encounter (INDEPENDENT_AMBULATORY_CARE_PROVIDER_SITE_OTHER): Payer: Self-pay | Admitting: Physician Assistant

## 2023-11-03 ENCOUNTER — Ambulatory Visit (INDEPENDENT_AMBULATORY_CARE_PROVIDER_SITE_OTHER): Payer: Medicare HMO | Admitting: Physician Assistant

## 2023-11-03 VITALS — BP 121/75 | HR 66 | Temp 98.0°F | Ht 63.0 in | Wt 205.0 lb

## 2023-11-03 DIAGNOSIS — R7303 Prediabetes: Secondary | ICD-10-CM | POA: Insufficient documentation

## 2023-11-03 DIAGNOSIS — Z6836 Body mass index (BMI) 36.0-36.9, adult: Secondary | ICD-10-CM | POA: Diagnosis not present

## 2023-11-03 DIAGNOSIS — R5383 Other fatigue: Secondary | ICD-10-CM | POA: Diagnosis not present

## 2023-11-03 DIAGNOSIS — E669 Obesity, unspecified: Secondary | ICD-10-CM | POA: Diagnosis not present

## 2023-11-03 DIAGNOSIS — E782 Mixed hyperlipidemia: Secondary | ICD-10-CM | POA: Diagnosis not present

## 2023-11-03 DIAGNOSIS — N951 Menopausal and female climacteric states: Secondary | ICD-10-CM

## 2023-11-03 DIAGNOSIS — E559 Vitamin D deficiency, unspecified: Secondary | ICD-10-CM | POA: Diagnosis not present

## 2023-11-03 MED ORDER — SERTRALINE HCL 50 MG PO TABS
50.0000 mg | ORAL_TABLET | Freq: Every day | ORAL | 0 refills | Status: DC
Start: 2023-11-03 — End: 2024-03-11

## 2023-11-03 NOTE — Progress Notes (Signed)
.[facility]  Office: [phone]  /  Fax: [phone]  WEIGHT SUMMARY AND BIOMETRICS  Vitals Temp: 98 F (36.7 C) BP: 121/75 Pulse Rate: 66 SpO2: 98 %   Anthropometric Measurements Height: 5\' 3"  (1.6 m) Weight: 205 lb (93 kg) BMI (Calculated): 36.32 Weight at Last Visit: 207 lb Weight Lost Since Last Visit: 2 lb Weight Gained Since Last Visit: 0 Starting Weight: 223 lb Total Weight Loss (lbs): 18 lb (8.165 kg) Peak Weight: 240 lb   Body Composition  Body Fat %: 46.2 % Fat Mass (lbs): 95 lbs Muscle Mass (lbs): 105 lbs Total Body Water (lbs): 76.6 lbs Visceral Fat Rating : 14   Other Clinical Data Fasting: yes Labs: yes Today's Visit #: 7 Starting Date: 04/30/23     HPI  Chief Complaint: OBESITY  Evany is here to discuss her progress with her obesity treatment plan. She is on the the Category 2 Plan + 100 calories and 8 oz protein and states she is following her eating plan approximately 70 % of the time. She states she is exercising walking/stationary bike 30 minutes 4 times per week. Discussed the use of AI scribe software for clinical note transcription with the patient, who gave verbal consent to proceed.  History of Present Illness  /   Interval History:  Since last office visit she is down 2 lbs.  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 She has a history of prediabetes, mixed hyperlipidemia, and vitamin D deficiency.  She has been actively trying to lose weight and has made lifestyle changes including diet and exercise. She reports that she has lost 18 pounds over the past four months. She is also on Zoloft.  She is maintaining her muscle mass well and has lost 4.2 pounds of adipose since August.  She also reports that her visceral adipose rating is now down to 14, with a goal of 12 and below.  She is walking about three times a week and going  to the gym at work once a week. She also reports that she is making better food choices and is more mindful of her protein and water intake. She reports that she is not following the program 100% but is making much better choices. She also reports that she is maintaining good energy levels.  Pharmacotherapy: None for weight loss  TREATMENT PLAN FOR OBESITY: Obesity Continued progress with weight loss, maintaining muscle mass, and decreasing adipose tissue. Patient is mindful of dietary choices and is engaging in regular physical activity. -Continue current weight loss program. -Encourage consistent physical activity, including indoor options like "Walking with [employer] during darker winter months. Recommended Dietary Goals  Rosaura is currently in the action stage of change. As such, her goal is to continue weight management plan. She has agreed to the Category 2 Plan.  Behavioral Intervention  We discussed the following Behavioral Modification Strategies today: continue to work on maintaining a reduced calorie state, getting the recommended amount of protein, incorporating whole foods, making healthy choices, staying well hydrated and practicing mindfulness when eating..  Additional resources provided today: NA  Recommended Physical Activity Goals  Skylin has been advised to work up to 150 minutes of moderate intensity aerobic activity a week and strengthening exercises 2-3 times per week for cardiovascular health, weight loss maintenance and preservation of muscle mass.   She has agreed to Think about  enjoyable ways to increase daily physical activity and overcoming barriers to exercise and Increase physical activity in their day and reduce sedentary time (increase NEAT).   Pharmacotherapy We discussed various medication options to help Rhealyn with her weight loss efforts and we both agreed to continue to work on nutritional and behavioral strategies to promote weight loss.      Return in about 4 weeks (around 12/01/2023).Marland Kitchen She was informed of the importance of frequent follow up visits to maximize her success with intensive lifestyle modifications for her multiple health conditions.  PHYSICAL EXAM:  Blood pressure 121/75, pulse 66, temperature 98 F (36.7 C), height 5\' 3"  (1.6 m), weight 205 lb (93 kg), SpO2 98%. Body mass index is 36.31 kg/m.  General: She is overweight, cooperative, alert, well developed, and in no acute distress. PSYCH: Has normal mood, affect and thought process.   Cardiovascular: HR 60's BP 121/75 Lungs: Normal breathing effort, no conversational dyspnea.  DIAGNOSTIC DATA REVIEWED:  BMET    Component Value Date/Time   NA 141 04/30/2023 0928   K 5.2 04/30/2023 0928   CL 102 04/30/2023 0928   CO2 18 (L) 04/30/2023 0928   GLUCOSE 94 04/30/2023 0928   GLUCOSE 92 07/31/2022 0932   BUN 18 04/30/2023 0928   CREATININE 0.79 04/30/2023 0928   CALCIUM 9.7 04/30/2023 0928   Lab Results  Component Value Date   HGBA1C 5.6 07/29/2023   HGBA1C 6.0 07/31/2022   Lab Results  Component Value Date   INSULIN 28.1 (H) 04/30/2023   Lab Results  Component Value Date   TSH 2.200 04/30/2023   CBC    Component Value Date/Time   WBC 6.3 04/30/2023 0928   WBC 6.1 07/31/2022 0932   RBC 4.84 04/30/2023 0928   RBC 4.73 07/31/2022 0932   HGB 13.6 04/30/2023 0928   HCT 42.0 04/30/2023 0928   PLT 280 04/30/2023 0928   MCV 87 04/30/2023 0928   MCH 28.1 04/30/2023 0928   MCHC 32.4 04/30/2023 0928   MCHC 32.8 07/31/2022 0932   RDW 12.9 04/30/2023 0928   Iron Studies No results found for: "IRON", "TIBC", "FERRITIN", "IRONPCTSAT" Lipid Panel     Component Value Date/Time   CHOL 155 02/04/2023 0830   TRIG 91.0 02/04/2023 0830   HDL 53.40 02/04/2023 0830   CHOLHDL 3 02/04/2023 0830   VLDL 18.2 02/04/2023 0830   LDLCALC 83 02/04/2023 0830   Hepatic Function Panel     Component Value Date/Time   PROT 7.0 04/30/2023 0928   ALBUMIN  4.6 04/30/2023 0928   AST 20 04/30/2023 0928   ALT 17 04/30/2023 0928   ALKPHOS 65 04/30/2023 0928   BILITOT 0.5 04/30/2023 0928   BILIDIR 0.1 02/04/2023 0830      Component Value Date/Time   TSH 2.200 04/30/2023 6045   Nutritional Lab Results  Component Value Date   VD25OH 50.9 04/30/2023    ASSOCIATED CONDITIONS ADDRESSED TODAY  ASSESSMENT AND PLAN  Problem List Items Addressed This Visit     Mixed hyperlipidemia   Relevant Orders   Lipid Panel With LDL/HDL Ratio   Hot flashes due to menopause   Relevant Medications   sertraline (ZOLOFT) 50 MG tablet   Prediabetes - Primary   Relevant Orders   CMP14+EGFR   Hemoglobin A1c   Insulin, random   Vitamin D deficiency   Relevant Orders   VITAMIN D 25 Hydroxy (Vit-D Deficiency, Fractures)   Other fatigue   Relevant Orders   TSH   Generalized  obesity- Start BMI 39.6   BMI 36.0-36.9,adult Current BMI 36.4    Prediabetes Last A1c was 5.6- at goal. Insulin 28.1- not at goal of <5.   Medication(s): None Polyphagia:No Lab Results  Component Value Date   HGBA1C 5.6 07/29/2023   HGBA1C 5.8 02/04/2023   HGBA1C 6.0 07/31/2022   Lab Results  Component Value Date   INSULIN 28.1 (H) 04/30/2023    Plan:  Continue working on nutrition plan to decrease simple carbohydrates, increase lean proteins and exercise to promote weight loss, improve glycemic control and prevent progression to Type 2 diabetes.  Recheck fasting labs today including A1c, insulin and CMET  Hyperlipidemia LDL is not at goal. Medication(s): Crestor 10 mg daily. No side effects reported.  Cardiovascular risk factors: advanced age (older than 23 for men, 49 for women), dyslipidemia, and obesity (BMI >= 30 kg/m2)  Lab Results  Component Value Date   CHOL 155 02/04/2023   HDL 53.40 02/04/2023   LDLCALC 83 02/04/2023   TRIG 91.0 02/04/2023   CHOLHDL 3 02/04/2023   CHOLHDL 3 07/31/2022   Lab Results  Component Value Date   ALT 17 04/30/2023   AST  20 04/30/2023   ALKPHOS 65 04/30/2023   BILITOT 0.5 04/30/2023   The 10-year ASCVD risk score (Arnett DK, et al., 2019) is: 5%   Values used to calculate the score:     Age: 89 years     Sex: Female     Is Non-Hispanic African American: No     Diabetic: No     Tobacco smoker: No     Systolic Blood Pressure: 121 mmHg     Is BP treated: No     HDL Cholesterol: 53.4 mg/dL     Total Cholesterol: 155 mg/dL  Plan: Continue statin. Continue to work on nutrition plan -decreasing simple carbohydrates, increasing lean proteins, decreasing saturated fats and cholesterol , avoiding trans fats and exercise as able to promote weight loss, improve lipids and decrease cardiovascular risks. Recheck fasting lipid panel today.    Vitamin D Deficiency Vitamin D is at goal of 50.  Most recent vitamin D level was 50.9. She is on OTC vitamin D3 2000 IU daily. Lab Results  Component Value Date   VD25OH 50.9 04/30/2023    Plan: Continue OTC vitamin D3 2000 IU daily Low vitamin D levels can be associated with adiposity and may result in leptin resistance and weight gain. Also associated with fatigue. Currently on vitamin D supplementation without any adverse effects.  Recheck vitamin D level today to optimize supplementation. .   Other fatigue: Endorses feeling fatigued at times.  Plan: Recheck labs noted and also check B 12 and TSH levels and treatment based on result.   Hot flashes due to menopause/ Depression Patient transitioned from Paxil to Zoloft with positive results. No current depressive symptoms reported.Reports no side effects with Zoloft.  -Refill /continue  Zoloft 50 mg daily.   General Health Maintenance -Order labs including CMP, A1C, fasting insulin level, fasting lipids, vitamin D, and TSH. -Follow-up appointment scheduled for 12/02/2023 at 8:15 AM.  ATTESTASTION STATEMENTS:  Reviewed by clinician on day of visit: allergies, medications, problem list, medical history,  surgical history, family history, social history, and previous encounter notes.   I have personally spent 37 minutes total time today in preparation, patient care, nutritional counseling and documentation for this visit, including the following: review of clinical lab tests; review of medical tests/procedures/services.      Zaviyar Rahal, PA-C

## 2023-11-04 LAB — CMP14+EGFR
ALT: 18 [IU]/L (ref 0–32)
AST: 23 [IU]/L (ref 0–40)
Albumin: 4.5 g/dL (ref 3.9–4.9)
Alkaline Phosphatase: 61 [IU]/L (ref 44–121)
BUN/Creatinine Ratio: 26 (ref 12–28)
BUN: 18 mg/dL (ref 8–27)
Bilirubin Total: 0.4 mg/dL (ref 0.0–1.2)
CO2: 20 mmol/L (ref 20–29)
Calcium: 9.9 mg/dL (ref 8.7–10.3)
Chloride: 103 mmol/L (ref 96–106)
Creatinine, Ser: 0.7 mg/dL (ref 0.57–1.00)
Globulin, Total: 1.9 g/dL (ref 1.5–4.5)
Glucose: 89 mg/dL (ref 70–99)
Potassium: 4.9 mmol/L (ref 3.5–5.2)
Sodium: 141 mmol/L (ref 134–144)
Total Protein: 6.4 g/dL (ref 6.0–8.5)
eGFR: 95 mL/min/{1.73_m2} (ref >59)

## 2023-11-04 LAB — LIPID PANEL WITH LDL/HDL RATIO
Cholesterol, Total: 168 mg/dL (ref 100–199)
HDL: 64 mg/dL (ref >39)
LDL Chol Calc (NIH): 91 mg/dL (ref 0–99)
LDL/HDL Ratio: 1.4 ratio (ref 0.0–3.2)
Triglycerides: 68 mg/dL (ref 0–149)
VLDL Cholesterol Cal: 13 mg/dL (ref 5–40)

## 2023-11-04 LAB — HEMOGLOBIN A1C
Est. average glucose Bld gHb Est-mCnc: 117 mg/dL
Hgb A1c MFr Bld: 5.7 % — ABNORMAL HIGH (ref 4.8–5.6)

## 2023-11-04 LAB — INSULIN, RANDOM: INSULIN: 21.7 u[IU]/mL (ref 2.6–24.9)

## 2023-11-04 LAB — VITAMIN D 25 HYDROXY (VIT D DEFICIENCY, FRACTURES): Vit D, 25-Hydroxy: 57 ng/mL (ref 30.0–100.0)

## 2023-11-04 LAB — TSH: TSH: 2.71 u[IU]/mL (ref 0.450–4.500)

## 2023-11-10 ENCOUNTER — Other Ambulatory Visit (INDEPENDENT_AMBULATORY_CARE_PROVIDER_SITE_OTHER): Payer: Self-pay | Admitting: Family Medicine

## 2023-11-10 DIAGNOSIS — N951 Menopausal and female climacteric states: Secondary | ICD-10-CM

## 2023-11-26 DIAGNOSIS — L814 Other melanin hyperpigmentation: Secondary | ICD-10-CM | POA: Diagnosis not present

## 2023-11-26 DIAGNOSIS — L578 Other skin changes due to chronic exposure to nonionizing radiation: Secondary | ICD-10-CM | POA: Diagnosis not present

## 2023-11-26 DIAGNOSIS — D229 Melanocytic nevi, unspecified: Secondary | ICD-10-CM | POA: Diagnosis not present

## 2023-11-26 DIAGNOSIS — L821 Other seborrheic keratosis: Secondary | ICD-10-CM | POA: Diagnosis not present

## 2023-12-01 ENCOUNTER — Ambulatory Visit (INDEPENDENT_AMBULATORY_CARE_PROVIDER_SITE_OTHER): Payer: Medicare HMO | Admitting: Family Medicine

## 2023-12-02 ENCOUNTER — Ambulatory Visit (INDEPENDENT_AMBULATORY_CARE_PROVIDER_SITE_OTHER): Payer: Medicare HMO | Admitting: Physician Assistant

## 2023-12-10 DIAGNOSIS — J3089 Other allergic rhinitis: Secondary | ICD-10-CM | POA: Diagnosis not present

## 2023-12-21 NOTE — Progress Notes (Unsigned)
SUBJECTIVE:  Chief Complaint: Obesity Discussed the use of AI scribe software for clinical note transcription with the patient, who gave verbal consent to proceed.  History of Present Illness     Interim History: She has maintained her weight since the last visit. Bio impedence scale reviewed with the patient:  Muscle mass -0.2 pounds Adipose mass -0.4 pounds Total body water -0.4 pounds  Down 18 pounds overall TBW loss of 8.1%  Mia Burnett is a 67 year old individual with a history of prediabetes, vitamin D deficiency, and mixed hyperlipidemia, presents for a follow-up visit regarding her obesity treatment plan. The patient reports struggling with dietary temptations, particularly during the holiday season, and admits to not always resisting these temptations. Despite this, she has not gained weight since the last visit.  The patient enjoys baking, which presents additional challenges during the holiday season. She also plans to cook a Christmas dinner, with a focus on healthier options such as green beans and roasted brussels sprouts.  The patient has been attending the gym once a week, spending approximately 30 minutes on the elliptical machine. She also engages in walking exercises at home, using Walking with [employer] on [employer]. She expresses interest in incorporating strength training into her routine in the future but expresses concern about a potential arthritis development in her shoulder, which had previously undergone rotator cuff repair.  The patient has been utilizing Fairlife milk and Activia probiotic yogurts as part of her dietary plan. We discussed trying other probiotic options, such as Pillars drinkable yogurt, to help manage evening hunger as well as adding prebiotic fiber and probiotics.  Since starting the obesity treatment program, the patient reports a noticeable decrease in her food intake capacity. She attributes this change to a shift in her dietary focus,  particularly towards protein consumption. Despite feeling that her weight loss progress has been slow, she acknowledges feeling better overall. She expresses a desire to continue with the program and is committed to maintaining her weight loss.  Kache is here to discuss her progress with her obesity treatment plan. She is on the Category 2 Plan and states she is following her eating plan approximately 50% of the time. She states she is exercising elliptical/walking 30-45 minutes 1/3 times per week.   OBJECTIVE: Visit Diagnoses: Problem List Items Addressed This Visit     Prediabetes - Primary   Vitamin D deficiency   Generalized obesity- Start BMI 39.6  Obesity 67 year old with obesity, currently in a weight management program. Lost 18 pounds (8.1% body weight) since May. Reports difficulty maintaining dietary discipline during holidays but uses Fairlife milk and Activia yogurt to manage hunger. Engages in physical activity, including 30 minutes on the elliptical weekly and walking exercises. Discussed benefits of protein loading before events and Pillars drinkable yogurt for nutritional balance. - Continue current dietary strategies, including Fairlife milk and exploring Pillars drinkable yogurt. - Maintain current exercise regimen; consider increasing frequency or incorporating strength training. - Follow-up in four weeks on January 22, 2024, at 12:30 PM.  Prediabetes Lab Results  Component Value Date   HGBA1C 5.7 (H) 11/03/2023   HGBA1C 5.6 07/29/2023   HGBA1C 5.8 02/04/2023   Lab Results  Component Value Date   LDLCALC 91 11/03/2023   CREATININE 0.70 11/03/2023    A1c is 5.7%, indicating prediabetes. Insulin level improved from 28.1 to 21.7. Advised to continue monitoring and reducing simple carbohydrate intake. - Continue monitoring carbohydrate intake and current dietary practices. Continue working on nutrition plan to decrease  simple carbohydrates, increase lean proteins and  exercise to promote weight loss, improve glycemic control and prevent progression to Type 2 diabetes.   Mixed Hyperlipidemia Lipid profile improved: Total cholesterol 168, HDL increased from 53 to 64, triglycerides decreased from 91 to 68, LDL 91. Goal is to reduce LDL to <70. - Continue Crestor 10 mg daily. Continue to work on nutrition plan -decreasing simple carbohydrates, increasing lean proteins, decreasing saturated fats and cholesterol , avoiding trans fats and exercise as able to promote weight loss, improve lipids and decrease cardiovascular risks.   Vitamin D Deficiency Vitamin D level is 57, within normal range. Currently taking 2000 units of vitamin D daily. - Continue 2000 units of vitamin D daily. Low vitamin D levels can be associated with adiposity and may result in leptin resistance and weight gain. Also associated with fatigue. Currently on vitamin D supplementation without any adverse effects.   General Health Maintenance Advised to maintain a balanced diet, engage in regular physical activity, and monitor health parameters. Encouraged to explore healthier recipe options for comfort foods. - Explore healthier recipes for comfort foods on websites like [email or web address] - Continue regular physical activity; consider incorporating strength training for bone health and osteoporosis prevention.  Follow-up - Follow-up appointment on January 22, 2024, at 12:30 PM.  Vitals Temp: 98.1 F (36.7 C) BP: 121/83 Pulse Rate: 68 SpO2: 98 %   Anthropometric Measurements Height: 5\' 3"  (1.6 m) Weight: 205 lb (93 kg) BMI (Calculated): 36.32 Weight at Last Visit: 205lb Weight Lost Since Last Visit: 0lb Weight Gained Since Last Visit: 0l Starting Weight: 223lb Total Weight Loss (lbs): 18 lb (8.165 kg) Peak Weight: 240lb   Body Composition  Body Fat %: 46.2 % Fat Mass (lbs): 94.6 lbs Muscle Mass (lbs): 104.8 lbs Total Body Water (lbs): 76.2 lbs Visceral Fat Rating :  14   Other Clinical Data Fasting: no Labs: no Today's Visit #: 8 Starting Date: 04/30/23     ASSESSMENT AND PLAN:  Diet: Zayda is currently in the action stage of change. As such, her goal is to continue with weight loss efforts. She has agreed to Category 2 Plan.  Exercise: Jernei has been instructed to work up to a goal of 150 minutes of combined cardio and strengthening exercise per week for weight loss and overall health benefits.   Behavior Modification:  We discussed the following Behavioral Modification Strategies today: increasing lean protein intake, decreasing simple carbohydrates, increasing vegetables, increase H2O intake, increase high fiber foods, meal planning and cooking strategies, better snacking choices, holiday eating strategies, and planning for success. We discussed various medication options to help Yanine with her weight loss efforts and we both agreed to continue to work on nutritional and behavioral strategies to promote weight loss.  .  Return in about 4 weeks (around 01/19/2024).Marland Kitchen She was informed of the importance of frequent follow up visits to maximize her success with intensive lifestyle modifications for her multiple health conditions.  Attestation Statements:   Reviewed by clinician on day of visit: allergies, medications, problem list, medical history, surgical history, family history, social history, and previous encounter notes.   Time spent on visit including pre-visit chart review and post-visit care and charting was 40 minutes.    Bennetta Rudden, PA-C

## 2023-12-22 ENCOUNTER — Ambulatory Visit (INDEPENDENT_AMBULATORY_CARE_PROVIDER_SITE_OTHER): Payer: Medicare HMO | Admitting: Physician Assistant

## 2023-12-22 ENCOUNTER — Encounter (INDEPENDENT_AMBULATORY_CARE_PROVIDER_SITE_OTHER): Payer: Self-pay | Admitting: Physician Assistant

## 2023-12-22 VITALS — BP 121/83 | HR 68 | Temp 98.1°F | Ht 63.0 in | Wt 205.0 lb

## 2023-12-22 DIAGNOSIS — E7849 Other hyperlipidemia: Secondary | ICD-10-CM

## 2023-12-22 DIAGNOSIS — Z6836 Body mass index (BMI) 36.0-36.9, adult: Secondary | ICD-10-CM

## 2023-12-22 DIAGNOSIS — E559 Vitamin D deficiency, unspecified: Secondary | ICD-10-CM

## 2023-12-22 DIAGNOSIS — E785 Hyperlipidemia, unspecified: Secondary | ICD-10-CM

## 2023-12-22 DIAGNOSIS — R7303 Prediabetes: Secondary | ICD-10-CM

## 2023-12-22 DIAGNOSIS — E669 Obesity, unspecified: Secondary | ICD-10-CM

## 2024-01-22 ENCOUNTER — Ambulatory Visit (INDEPENDENT_AMBULATORY_CARE_PROVIDER_SITE_OTHER): Payer: Medicare HMO | Admitting: Physician Assistant

## 2024-02-03 ENCOUNTER — Encounter: Payer: Medicare HMO | Admitting: Family

## 2024-02-10 ENCOUNTER — Ambulatory Visit (INDEPENDENT_AMBULATORY_CARE_PROVIDER_SITE_OTHER): Payer: Medicare HMO | Admitting: Physician Assistant

## 2024-02-10 ENCOUNTER — Encounter (INDEPENDENT_AMBULATORY_CARE_PROVIDER_SITE_OTHER): Payer: Self-pay | Admitting: Physician Assistant

## 2024-02-10 VITALS — BP 123/78 | HR 63 | Temp 97.9°F | Ht 63.0 in | Wt 203.0 lb

## 2024-02-10 DIAGNOSIS — E669 Obesity, unspecified: Secondary | ICD-10-CM

## 2024-02-10 DIAGNOSIS — R7303 Prediabetes: Secondary | ICD-10-CM | POA: Diagnosis not present

## 2024-02-10 DIAGNOSIS — E7849 Other hyperlipidemia: Secondary | ICD-10-CM | POA: Diagnosis not present

## 2024-02-10 DIAGNOSIS — Z6835 Body mass index (BMI) 35.0-35.9, adult: Secondary | ICD-10-CM

## 2024-02-10 DIAGNOSIS — N951 Menopausal and female climacteric states: Secondary | ICD-10-CM | POA: Diagnosis not present

## 2024-02-10 DIAGNOSIS — E559 Vitamin D deficiency, unspecified: Secondary | ICD-10-CM | POA: Diagnosis not present

## 2024-02-10 MED ORDER — TOPIRAMATE 25 MG PO TABS: 25.0000 mg | ORAL_TABLET | Freq: Every day | ORAL | 0 refills | Status: DC

## 2024-02-10 NOTE — Progress Notes (Signed)
SUBJECTIVE: Discussed the use of AI scribe software for clinical note transcription with the patient, who gave verbal consent to proceed.  Chief Complaint: Obesity  Interim History: She is down 2 lbs since her last visit.  Down 20 lbs overall TBW loss of ~9%  Mia Burnett is here to discuss her progress with her obesity treatment plan. She is on the Category 2 Plan and states she is following her eating plan approximately 80 % of the time. She states she is exercising walking/riding recumbent bike 30-45 minutes 3 times per week.  Mia Burnett is a 69 year old female who presents for follow-up of her obesity treatment plan.  She is adhering to her category two nutrition plan approximately eighty percent of the time and has achieved a weight loss of two pounds since her last visit. She desires more rapid weight loss but is not interested in injectable treatments.   She faces challenges with cravings, particularly for foods she should avoid, and although she is mostly successful in avoiding them, she feels too full to eat foods on her plan, but can still consume foods she prefers. This leads to frustration when results are not as quick as she hopes. Social situations, such as dining out with coworkers, also present challenges.  She is currently taking cholecalciferol 2000 units daily and Crestor 10 mg daily. She is on Zoloft, which she started for hot flashes, having switched from Paxil due to adverse effects. Her medical history includes prediabetes, vitamin D deficiency, and mixed hyperlipidemia.   We discussed considering starting topiramate for cravings/emotional eating. She has no history of kidney stones, glaucoma, or seizure disorders.She is post menopausal.   She experiences issues with her right shoulder and left knee, with the latter being aggravated by Zumba, leading her to stop participating in it. She uses a recumbent bike for exercise and has tried Voltaren gel for her knee, which  causes itching. She plans to follow up with orthopedics  She works four days a week, totaling thirty-two hours, and has adjusted her schedule to have Fridays off, which she finds beneficial. She has three elderly dogs, including a Westie with dementia and two dogs with Cushing's disease, which require her attention at home. OBJECTIVE: Visit Diagnoses: Problem List Items Addressed This Visit     Other hyperlipidemia   Hot flashes due to menopause   Prediabetes - Primary   Relevant Medications   topiramate (TOPAMAX) 25 MG tablet   Vitamin D deficiency   Generalized obesity- Start BMI 39.6  Obesity Mia Burnett, 67, has been following her category two nutrition plan 80% of the time and has lost 2 pounds since her last visit. She desires faster weight loss and reports cravings for restricted foods. She is not interested in injectable treatments. Discussed topiramate for cravings, explaining its off-label use for weight loss and potential side effects, including altered taste of carbonated beverages and improved sleep when taken at bedtime. - Prescribe topiramate 25 mg at bedtime, increase to 50 mg nightly after 2 weeks if well tolerated - Advise discontinuation if adverse effects occur and discuss at next visit  Prediabetes with cravings Managing prediabetes with lifestyle modifications and medication. The addition of topiramate may help manage cravings and improve glycemic control. - Continue current lifestyle modifications and dietary plan Start topiramate 25 mg at bedtime, increase to 50 mg nightly after 2 weeks if well tolerated Continue working on nutrition plan to decrease simple carbohydrates, increase lean proteins and exercise to promote weight  loss, improve glycemic control and prevent progression to Type 2 diabetes.    Mixed Hyperlipidemia Well-managed mixed hyperlipidemia on Crestor 10 mg daily. - Continue Crestor 10 mg daily Continue to work on nutrition plan -decreasing  simple carbohydrates, increasing lean proteins, decreasing saturated fats and cholesterol , avoiding trans fats and exercise as able to promote weight loss, improve lipids and decrease cardiovascular risks.  Vitamin D Deficiency Well-managed vitamin D deficiency on cholecalciferol 2000 units daily. No N/V or muscle weakness with OTC vitamin D.  Last vitamin D Lab Results  Component Value Date   VD25OH 57.0 11/03/2023  Continue cholecalciferol 2000 units daily Low vitamin D levels can be associated with adiposity and may result in leptin resistance and weight gain. Also associated with fatigue.  Currently on vitamin D supplementation without any adverse effects such as nausea, vomiting or muscle weakness.  Recheck vitamin D levels several times yearly to optimize supplementation/avoid over supplementation.   Hot flashes due to menopause Well controlled on Zoloft 50 mg daily. No side effects. Continue Zoloft 50 mg daily. No refills needed this visit. Continue to work on nutrition plan and exercise to promote weight loss.  General Health Maintenance Generally well with current medications and lifestyle modifications. Working four days a week with Fridays off, positively impacting well-being. Discussed benefits of low-impact exercises like chair yoga for knee and shoulder issues. - Encourage continued adherence to lifestyle modifications - Recommend chair yoga or other low-impact exercises for knee and shoulder issues  Follow-up - Schedule follow-up appointment in 4 weeks on February 11, 2024, at 12:30 PM.  Vitals Temp: 97.9 F (36.6 C) BP: 123/78 Pulse Rate: 63 SpO2: 99 %   Anthropometric Measurements Height: 5\' 3"  (1.6 m) Weight: 203 lb (92.1 kg) BMI (Calculated): 35.97 Weight at Last Visit: 205lb Weight Lost Since Last Visit: 2lb Weight Gained Since Last Visit: 0lb Starting Weight: 223lb Total Weight Loss (lbs): 20 lb (9.072 kg)   Body Composition  Body Fat %: 46.6 % Fat  Mass (lbs): 94.8 lbs Muscle Mass (lbs): 103.2 lbs Total Body Water (lbs): 77.6 lbs Visceral Fat Rating : 14   Other Clinical Data Fasting: No Labs: No Today's Visit #: 9 Starting Date: 04/30/23     ASSESSMENT AND PLAN:  Diet: Mia Burnett is currently in the action stage of change. As such, her goal is to continue with weight loss efforts. She has agreed to Category 2 Plan.  Exercise: Mia Burnett has been instructed  try a chair exercise program  for weight loss and overall health benefits.   Behavior Modification:  We discussed the following Behavioral Modification Strategies today: increasing lean protein intake, decreasing simple carbohydrates, increasing vegetables, increase H2O intake, increase high fiber foods, meal planning and cooking strategies, emotional eating strategies , avoiding temptations, and planning for success. We discussed various medication options to help Mia Burnett with her weight loss efforts and we both agreed to start topiramate (Topamax) for cravings/emotional eating and continue to work on nutritional and behavioral strategies to promote weight loss.  .  Return in about 4 weeks (around 03/09/2024).Marland Kitchen She was informed of the importance of frequent follow up visits to maximize her success with intensive lifestyle modifications for her multiple health conditions.  Attestation Statements:   Reviewed by clinician on day of visit: allergies, medications, problem list, medical history, surgical history, family history, social history, and previous encounter notes.   Time spent on visit including pre-visit chart review and post-visit care and charting was 43 minutes.  Mareta Chesnut, PA-C

## 2024-02-15 ENCOUNTER — Other Ambulatory Visit (INDEPENDENT_AMBULATORY_CARE_PROVIDER_SITE_OTHER): Payer: Self-pay | Admitting: Physician Assistant

## 2024-02-15 DIAGNOSIS — N951 Menopausal and female climacteric states: Secondary | ICD-10-CM

## 2024-03-08 ENCOUNTER — Other Ambulatory Visit (INDEPENDENT_AMBULATORY_CARE_PROVIDER_SITE_OTHER): Payer: Self-pay | Admitting: Family Medicine

## 2024-03-08 DIAGNOSIS — N951 Menopausal and female climacteric states: Secondary | ICD-10-CM

## 2024-03-10 ENCOUNTER — Ambulatory Visit (INDEPENDENT_AMBULATORY_CARE_PROVIDER_SITE_OTHER): Payer: Medicare HMO | Admitting: Physician Assistant

## 2024-03-10 ENCOUNTER — Other Ambulatory Visit (INDEPENDENT_AMBULATORY_CARE_PROVIDER_SITE_OTHER): Payer: Self-pay | Admitting: Physician Assistant

## 2024-03-10 DIAGNOSIS — N951 Menopausal and female climacteric states: Secondary | ICD-10-CM

## 2024-03-11 ENCOUNTER — Other Ambulatory Visit (INDEPENDENT_AMBULATORY_CARE_PROVIDER_SITE_OTHER): Payer: Self-pay | Admitting: Physician Assistant

## 2024-03-11 ENCOUNTER — Encounter (INDEPENDENT_AMBULATORY_CARE_PROVIDER_SITE_OTHER): Payer: Self-pay | Admitting: Physician Assistant

## 2024-03-11 DIAGNOSIS — N951 Menopausal and female climacteric states: Secondary | ICD-10-CM

## 2024-03-11 DIAGNOSIS — R7303 Prediabetes: Secondary | ICD-10-CM

## 2024-03-11 MED ORDER — SERTRALINE HCL 50 MG PO TABS: 50.0000 mg | ORAL_TABLET | Freq: Every day | ORAL | 0 refills | Status: DC

## 2024-03-11 MED ORDER — TOPIRAMATE 25 MG PO TABS: 25.0000 mg | ORAL_TABLET | Freq: Every day | ORAL | 0 refills | Status: DC

## 2024-03-11 NOTE — Telephone Encounter (Signed)
 Sent to [facility] with refill of sertraline

## 2024-03-11 NOTE — Telephone Encounter (Signed)
 Pt also needs topiramate. Please advise  Appt 03/23/24

## 2024-03-22 NOTE — Progress Notes (Unsigned)
 SUBJECTIVE: Discussed the use of AI scribe software for clinical note transcription with the patient, who gave verbal consent to proceed.  Chief Complaint: Obesity  Interim History: She has maintained her weight since last visit.  Down 20 lbs overall TBW loss of ~ 9%  Mia Burnett is here to discuss her progress with her obesity treatment plan. She is on the Category 2 Plan and states she is following her eating plan approximately 85 % of the time. She states she is exercising 30-40 minutes 5 times per week.  Mia Burnett is a 68 year old female who was seen for follow up of her obesity treatment plan. She has a history of prediabetes with cravings, hyperlipidemia, and vitamin D deficiency.  She has lost twenty pounds overall and feels less bloated, with clothes fitting better. Some old jeans are now too big, indicating progress in her weight management journey.  She is currently taking Topamax at a dose of 50 mg at night, increased from 25 mg after three weeks. No significant side effects are noted, although she experiences some evening headaches, possibly due to computer use.  She is also on Zoloft and requests a refill for both medications to be sent to [facility] pharmacy for mail delivery. She takes 2000 units of vitamin D daily and believes she is taking a B12 supplement, though unsure of the dosage. She continues to take half an Ambien for sleep but reports no significant change in sleep quality.  She engages in regular physical activity, including walking, deep cleaning, and yard work, which she describes as 'lots of moving.' Preparing for her daughter and son-in-law's move to [place] involves additional physical activity and stress.  She is planning a beach trip to [facility] with her family before her daughter and son-in-law's move, taking two weeks off work for this purpose. Her appetite has decreased, and even when eating the wrong foods, she consumes less than before.  She has worked  for her current employer for 23 years and is nearing retirement age.   She has an elderly dog with Cushing's disease and dementia, which she plans to take on a road trip to visit her daughter in [place].  OBJECTIVE: Visit Diagnoses: Problem List Items Addressed This Visit     Hot flashes due to menopause   Relevant Medications   sertraline (ZOLOFT) 50 MG tablet   Prediabetes - Primary   Relevant Medications   topiramate (TOPAMAX) 25 MG tablet   Vitamin D deficiency   Generalized obesity- Start BMI 39.6   BMI 36.0-36.9,adult Current BMI 36.4  Obesity management Experiencing positive changes in clothing fit, indicating progress in weight management despite the scale not showing significant weight loss. On Topamax, which may be contributing to reduced cravings and weight management. Reports no significant side effects from Topamax, except for some evening headaches, which are unlikely related to the medication as it is typically used to treat headaches. Engaging in regular physical activity, including walking, yard work, and helping with house cleaning, which contributes to weight management. Advised to focus on protein intake, especially during travel, to maintain muscle mass and support weight management. Discussed the option to adjust Topamax dosage based on individual response and needs. - Continue Topamax at 50 mg at night, with the option to adjust the dose to 25 mg or increase to 75 mg if needed. - Send Topamax prescription to [facility] pharmacy for a 90-day supply. - Encourage regular physical activity and focus on protein intake at meals.   Prediabetes with  cravings Last A1c was 5.7- slightly up. Insulin level improved.   Medication(s): Topiramate 50 mg nightly No side effects Polyphagia:No Lab Results  Component Value Date   HGBA1C 5.7 (H) 11/03/2023   HGBA1C 5.6 07/29/2023   HGBA1C 5.8 02/04/2023   HGBA1C 6.0 07/31/2022   Lab Results  Component Value Date   INSULIN 21.7  11/03/2023   INSULIN 28.1 (H) 04/30/2023    Plan: Continue and refill Topiramate 50 mg nightly.  Continue working on nutrition plan to decrease simple carbohydrates, increase lean proteins and exercise to promote weight loss, improve glycemic control and prevent progression to Type 2 diabetes.  Meds ordered this encounter  Medications   sertraline (ZOLOFT) 50 MG tablet    Sig: Take 1 tablet (50 mg total) by mouth daily.    Dispense:  90 tablet    Refill:  0   topiramate (TOPAMAX) 25 MG tablet    Sig: Take 2 tablets (50 mg total) by mouth daily.    Dispense:  180 tablet    Refill:  0    Hot flashes due to menopause. Currently on Zoloft for menopausal symptoms and reports well-managed mood. Managing stress related to family changes and upcoming travel. Planning a vacation and has support from family and work, which may help in managing stress and mood. - Send Zoloft prescription to [facility] pharmacy for a 90-day supply. Meds ordered this encounter  Medications   sertraline (ZOLOFT) 50 MG tablet    Sig: Take 1 tablet (50 mg total) by mouth daily.    Dispense:  90 tablet    Refill:  0   topiramate (TOPAMAX) 25 MG tablet    Sig: Take 2 tablets (50 mg total) by mouth daily.    Dispense:  180 tablet    Refill:  0    Vitamin D deficiency Taking 2000 units of vitamin D daily, which is important for bone health, especially as they age. Advised to continue this supplementation to maintain adequate vitamin D levels. Last vitamin D Lab Results  Component Value Date   VD25OH 57.0 11/03/2023    - Continue taking 2000 units of vitamin D daily. Low vitamin D levels can be associated with adiposity and may result in leptin resistance and weight gain. Also associated with fatigue.  Currently on vitamin D supplementation without any adverse effects such as nausea, vomiting or muscle weakness.  Recheck vitamin D levels over the next 1-2 months to optimize supplementation/avoid over  supplementation.   Vitamin B12 supplementation Taking vitamin B12 supplements, which can help with energy levels. Advised to continue this supplementation to support energy and overall health. - Continue taking vitamin B12 supplements. Will plan to check B 12 levels with next lab draw.   Vitals Temp: 97.6 F (36.4 C) BP: 117/77 Pulse Rate: 65 SpO2: 99 %   Anthropometric Measurements Height: 5\' 3"  (1.6 m) Weight: 203 lb (92.1 kg) BMI (Calculated): 35.97 Weight at Last Visit: 203 lb Weight Lost Since Last Visit: 0 lb Weight Gained Since Last Visit: 0 lb Starting Weight: 223 lb Total Weight Loss (lbs): 20 lb (9.072 kg)   Body Composition  Body Fat %: 47 % Fat Mass (lbs): 95.6 lbs Muscle Mass (lbs): 102.4 lbs Total Body Water (lbs): 78.4 lbs Visceral Fat Rating : 14   Other Clinical Data Fasting: no Labs: no Today's Visit #: 10 Starting Date: 05/01/23     ASSESSMENT AND PLAN:  Diet: Agata is currently in the action stage of change. As such, her  goal is to continue with weight loss efforts. She has agreed to Category 2 Plan.  Exercise: Dollene has been instructed to work up to a goal of 150 minutes of combined cardio and strengthening exercise per week for weight loss and overall health benefits.   Behavior Modification:  We discussed the following Behavioral Modification Strategies today: increasing lean protein intake, decreasing simple carbohydrates, increasing vegetables, increase H2O intake, increase high fiber foods, meal planning and cooking strategies, travel eating strategies, avoiding temptations, and planning for success. We discussed various medication options to help Francess with her weight loss efforts and we both agreed to continue topiramate for cravings/emotional eating and current medications, continue to work on nutritional and behavioral strategies to promote weight loss.   .  Return in about 5 weeks (around 04/27/2024).Marland Kitchen She was informed of the  importance of frequent follow up visits to maximize her success with intensive lifestyle modifications for her multiple health conditions.  Attestation Statements:   Reviewed by clinician on day of visit: allergies, medications, problem list, medical history, surgical history, family history, social history, and previous encounter notes.   Time spent on visit including pre-visit chart review and post-visit care and charting was 34 minutes.    Nayana Lenig, PA-C

## 2024-03-23 ENCOUNTER — Encounter (INDEPENDENT_AMBULATORY_CARE_PROVIDER_SITE_OTHER): Payer: Self-pay | Admitting: Physician Assistant

## 2024-03-23 ENCOUNTER — Ambulatory Visit (INDEPENDENT_AMBULATORY_CARE_PROVIDER_SITE_OTHER): Admitting: Physician Assistant

## 2024-03-23 VITALS — BP 117/77 | HR 65 | Temp 97.6°F | Ht 63.0 in | Wt 203.0 lb

## 2024-03-23 DIAGNOSIS — N951 Menopausal and female climacteric states: Secondary | ICD-10-CM

## 2024-03-23 DIAGNOSIS — E559 Vitamin D deficiency, unspecified: Secondary | ICD-10-CM | POA: Diagnosis not present

## 2024-03-23 DIAGNOSIS — R7303 Prediabetes: Secondary | ICD-10-CM

## 2024-03-23 DIAGNOSIS — E669 Obesity, unspecified: Secondary | ICD-10-CM | POA: Diagnosis not present

## 2024-03-23 DIAGNOSIS — Z6836 Body mass index (BMI) 36.0-36.9, adult: Secondary | ICD-10-CM | POA: Diagnosis not present

## 2024-03-23 MED ORDER — SERTRALINE HCL 50 MG PO TABS: 50.0000 mg | ORAL_TABLET | Freq: Every day | ORAL | 0 refills | Status: DC

## 2024-03-23 MED ORDER — TOPIRAMATE 25 MG PO TABS: 50.0000 mg | ORAL_TABLET | Freq: Every day | ORAL | 0 refills | Status: DC

## 2024-04-05 IMAGING — MR MR BREAST BILAT WO/W CM
8 of 12 series · 33 of 48 positions shown · IV contrast (9 ml gadavist)
Comparison: Prior exams including previous breast MRIs, most recent
dated 07/12/2020.

CLINICAL DATA: High risk screening breast MRI. Family history
withbreast carcinoma diagnosed in her mother, age 70, and 3 sisters,
48,55 and 60 years old. History of bilateral breast reduction
surgeryin [id number]./ pt states rt breast pain

EXAM:
BILATERAL BREAST MRI WITH AND WITHOUT CONTRAST
TECHNIQUE: Multiplanar, multisequence MR images of both breasts were obtained
prior to and following the intravenous administration of 9 ml of
Gadavist

[Series 2: t2_tirm_tra ipat (a-p) · axial · 3.0mm · 0.70mm/px · 1 of 59 slices shown]
[im 1/59]
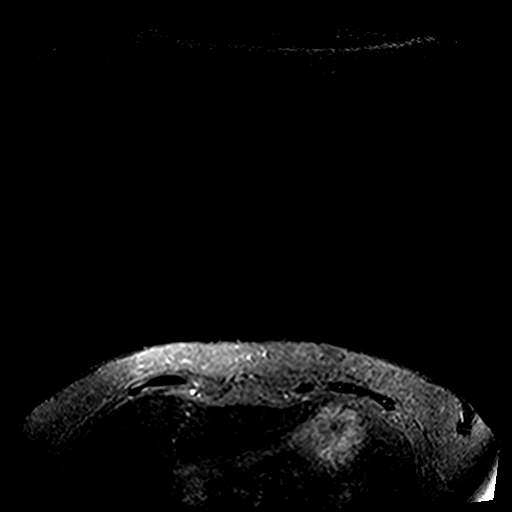

[Series 3: fl3d pre-cm no · axial · non-contrast · 1.2mm · 0.94mm/px · z∈[-100,+72]mm · 5 of 144 slices shown]
[im 1/144]
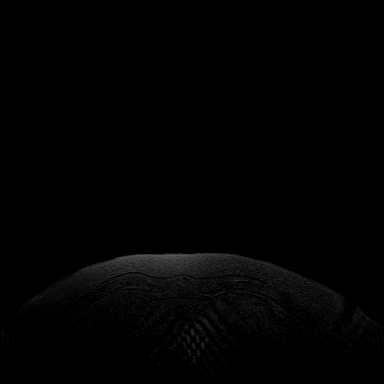
[im 36/144]
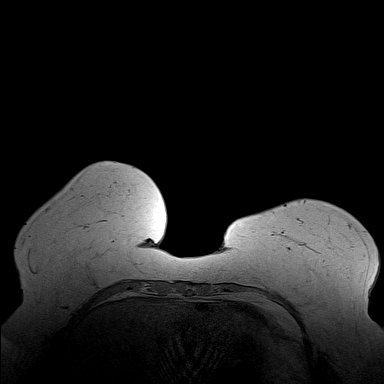
[im 72/144]
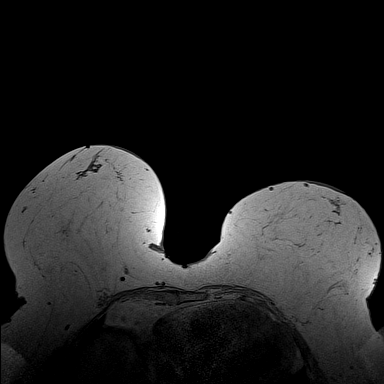
[im 108/144]
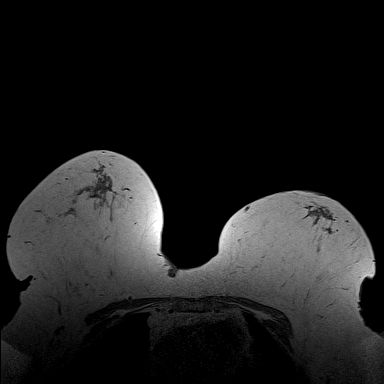
[im 144/144]
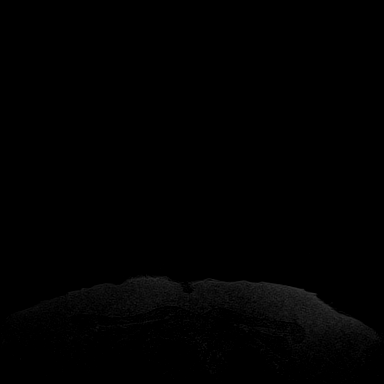

[Series 4: fl3d pre-cm · axial · non-contrast · 1.2mm · 0.94mm/px · z∈[-100,+72]mm · 5 of 144 slices shown]
[im 1/144]
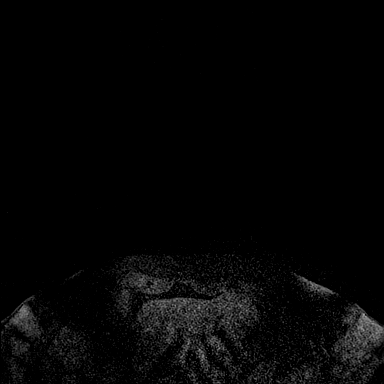
[im 36/144]
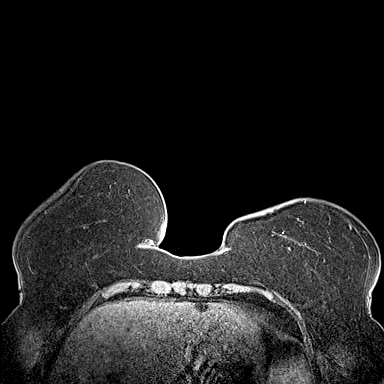
[im 72/144]
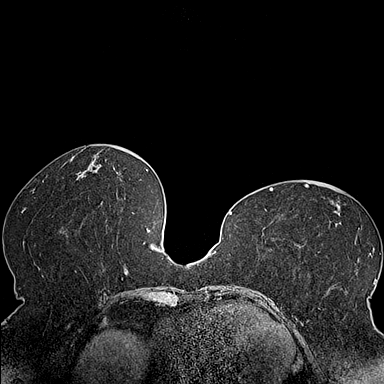
[im 108/144]
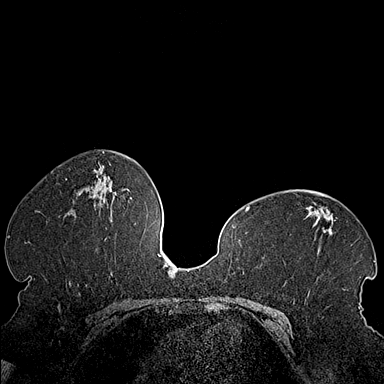
[im 144/144]
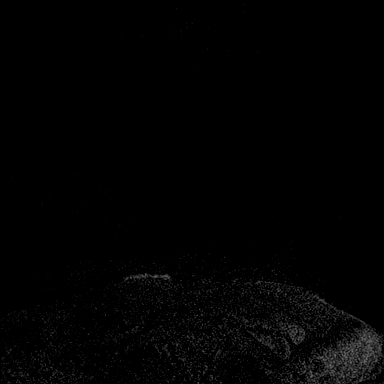

[Series 5: fl3d post-cm 20 · axial · 1.2mm · 0.94mm/px · z∈[-100,+72]mm · 5 of 144 slices shown (1 of 3)]
[im 1/144]
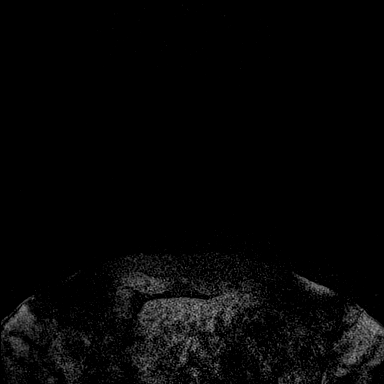
[im 36/144]
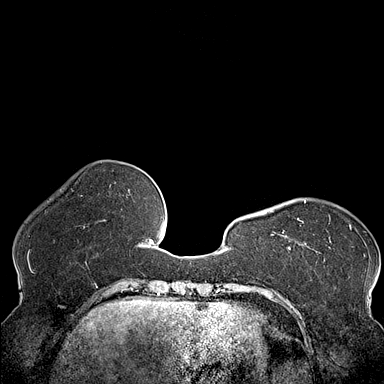
[im 72/144]
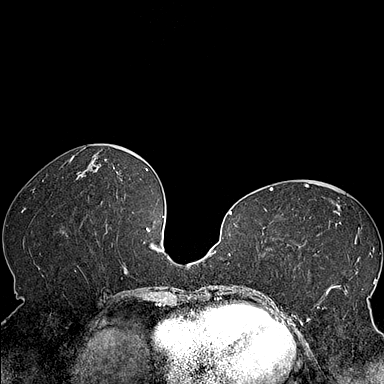
[im 108/144]
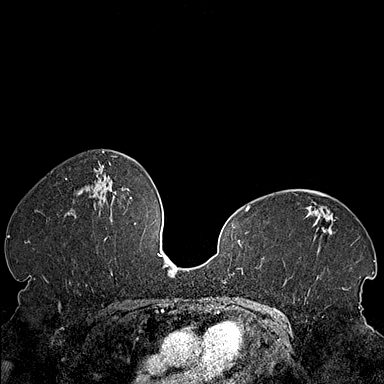
[im 144/144]
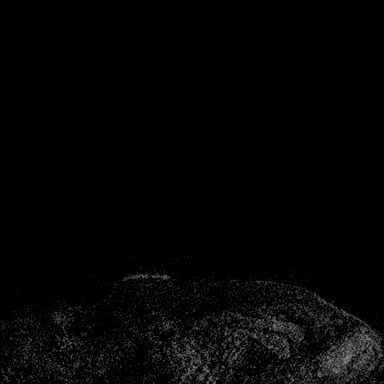

[Series 6: fl3d post-cm 20 · axial · 1.2mm · 0.94mm/px · z∈[-100,+72]mm · 5 of 144 slices shown (2 of 3)]
[im 1/144]
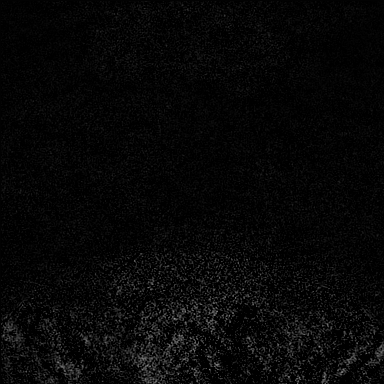
[im 36/144]
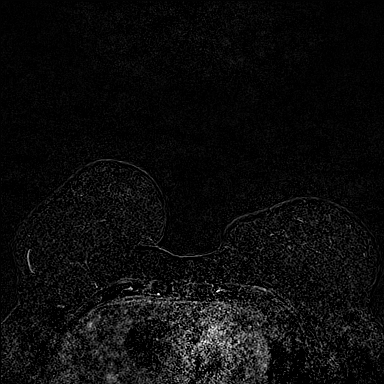
[im 72/144]
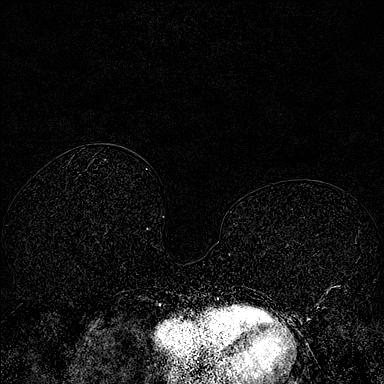
[im 108/144]
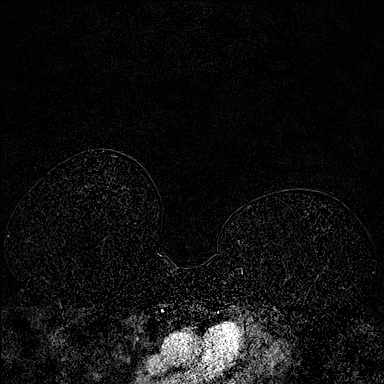
[im 144/144]
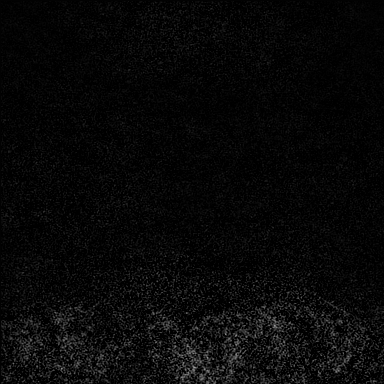

[Series 7: fl3d post-cm 20 · axial · 172.8mm · 0.94mm/px · 1 of 1 slices shown (3 of 3)]
[im 1/1]
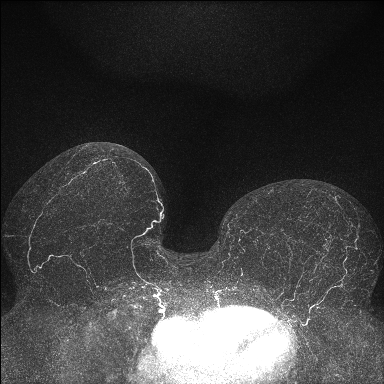

[Series 8: fl3d post-cm 3 · axial · 1.2mm · 0.94mm/px · z∈[-100,+72]mm · 6 of 144 slices shown (1 of 2)]
[im 1/144]
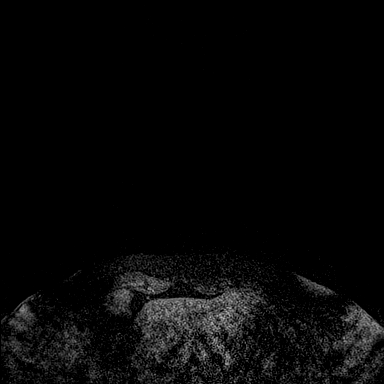
[im 29/144]
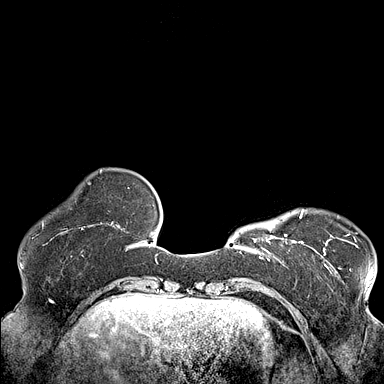
[im 58/144]
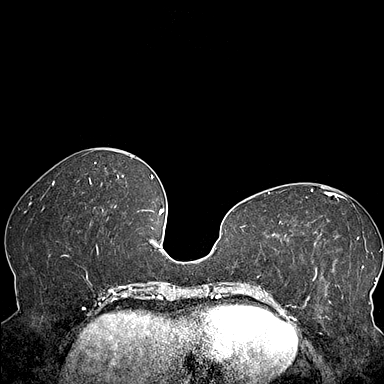
[im 86/144]
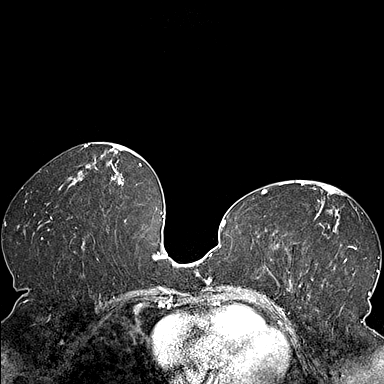
[im 115/144]
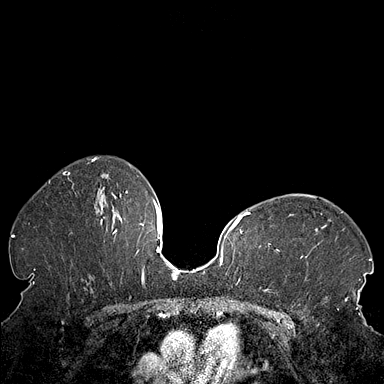
[im 144/144]
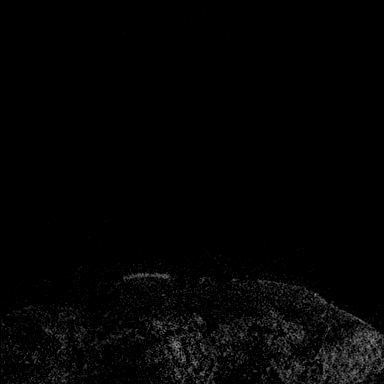

[Series 9: fl3d post-cm 3 · axial · 1.2mm · 0.94mm/px · z∈[-100,+37]mm · 5 of 144 slices shown (2 of 2)]
[im 1/144]
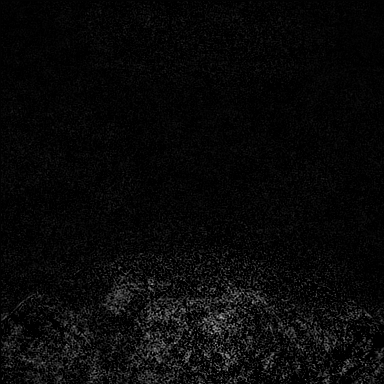
[im 29/144]
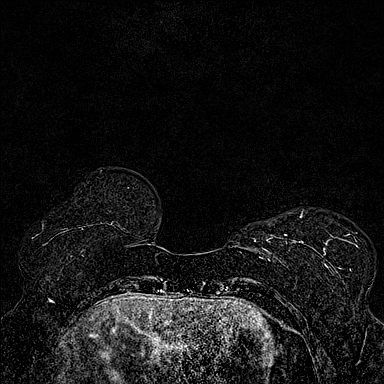
[im 58/144]
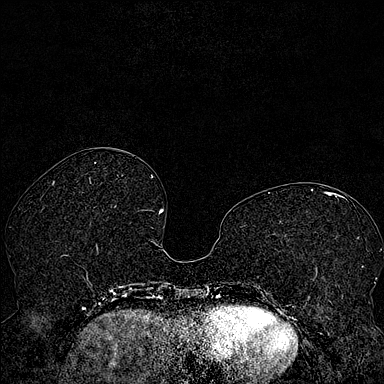
[im 86/144]
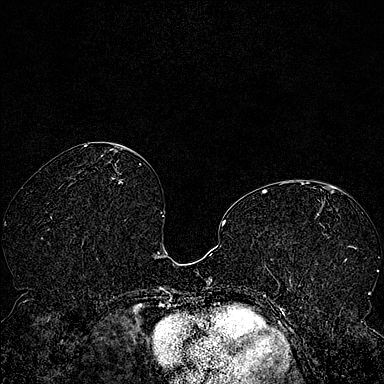
[im 115/144]
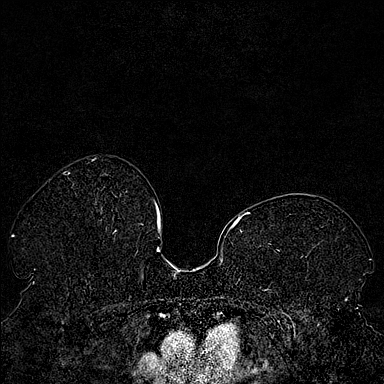

[33 of 48 positions shown; findings below may reference images not displayed]

Three-dimensional MR images were rendered by post-processing of the
original MR data on an independent workstation. The
three-dimensional MR images were interpreted, and findings are
reported in the following complete MRI report for this study. Three
dimensional images were evaluated at the independent interpreting
workstation using the DynaCAD thin client.
FINDINGS: Breast composition: b. Scattered fibroglandular tissue.

Background parenchymal enhancement: Minimal

Right breast: No mass or abnormal enhancement.

Left breast: No mass or abnormal enhancement.

Lymph nodes: No abnormal appearing lymph nodes.

Ancillary findings:  None.
IMPRESSION: No MRI evidence of breast malignancy.

RECOMMENDATION:
1. Annual screening mammography.
2. Supplemental high risk annual screening breast MRI based on the
patient's elevated lifetime risk for developing breast carcinoma.

BI-RADS CATEGORY  1: Negative.

## 2024-04-11 ENCOUNTER — Other Ambulatory Visit: Payer: Self-pay | Admitting: Family

## 2024-04-11 DIAGNOSIS — E782 Mixed hyperlipidemia: Secondary | ICD-10-CM

## 2024-04-27 ENCOUNTER — Ambulatory Visit (INDEPENDENT_AMBULATORY_CARE_PROVIDER_SITE_OTHER): Admitting: Physician Assistant

## 2024-05-05 ENCOUNTER — Encounter: Payer: Self-pay | Admitting: Family Medicine

## 2024-05-05 ENCOUNTER — Ambulatory Visit (INDEPENDENT_AMBULATORY_CARE_PROVIDER_SITE_OTHER): Admitting: Family Medicine

## 2024-05-05 VITALS — BP 124/74 | HR 79 | Temp 98.6°F | Ht 63.0 in | Wt 205.8 lb

## 2024-05-05 DIAGNOSIS — J029 Acute pharyngitis, unspecified: Secondary | ICD-10-CM | POA: Insufficient documentation

## 2024-05-05 DIAGNOSIS — J020 Streptococcal pharyngitis: Secondary | ICD-10-CM | POA: Diagnosis not present

## 2024-05-05 LAB — POCT RAPID STREP A (OFFICE): Rapid Strep A Screen: POSITIVE — AB

## 2024-05-05 MED ORDER — AMOXICILLIN 500 MG PO CAPS: 1000.0000 mg | ORAL_CAPSULE | Freq: Two times a day (BID) | ORAL | 0 refills | Status: DC

## 2024-05-05 NOTE — Assessment & Plan Note (Signed)
 Acute, will treat with amoxicillin  500 mg 2 tablets twice daily x 10 days.  Symptomatic care with ibuprofen/Tylenol as needed. Encouraged p.o. intake and hydration.  Return and ER precautions provided.

## 2024-05-05 NOTE — Progress Notes (Signed)
 Looks that like my eye is not very I left there is at on top of scar and it landed in the LAD in the driveway   Patient ID: Mia Burnett, female    DOB: 30-Oct-1956, 68 y.o.   MRN: [id number]  This visit was conducted in person.  BP 124/74   Pulse 79   Temp 98.6 F (37 C) (Oral)   Ht 5\' 3"  (1.6 m)   Wt 205 lb 12.8 oz (93.4 kg)   SpO2 99%   BMI 36.46 kg/m    CC:  Chief Complaint  Patient presents with   Sore Throat    Began yesterday   Cough    Started Monday. Overall just feels bad along with a headache    Subjective:   HPI: Mia Burnett is a 68 y.o. female presenting on 05/05/2024 for Sore Throat (Began yesterday) and Cough (Started Monday. Overall just feels bad along with a headache)    Date of onset:  2 days Initial symptoms included   dry cough Symptoms progressed to ST and headache. Minimal congestion  Feeling fatigue, some body aches.  No fever. Some chills.   Mild bilateral ear pain, anterior gland pain.   Stomach upset.    Sick contacts:    at work.. nothing specific COVID testing:   negative     She has tried to treat with  tylenol, theraflu.     No history of chronic lung disease such as asthma or COPD. Non-smoker.       Relevant past medical, surgical, family and social history reviewed and updated as indicated. Interim medical history since our last visit reviewed. Allergies and medications reviewed and updated. Outpatient Medications Prior to Visit  Medication Sig Dispense Refill   albuterol (VENTOLIN HFA) 108 (90 Base) MCG/ACT inhaler Inhale into the lungs.     Calcium  Citrate (CITRACAL PO) Take 800 mg by mouth.     Cholecalciferol (VITAMIN D3) 50 MCG (2000 UT) TABS Take by mouth.     Coenzyme Q10 (COQ10) 200 MG CAPS Take by mouth.     EPINEPHrine 0.3 mg/0.3 mL IJ SOAJ injection AUTO-INJECTOR INTO OUTER THIGH INJECTION AS NEEDED FOR ANAPHYLAXIS.     fluticasone (FLONASE ALLERGY RELIEF) 50 MCG/ACT nasal spray 2 sprays     montelukast  (SINGULAIR) 10 MG tablet      Multiple Vitamin (MULTIVITAMIN) capsule Take 1 capsule by mouth daily.     omeprazole  (PRILOSEC) 20 MG capsule TAKE 1 CAPSULE EVERY DAY 90 capsule 3   PRESCRIPTION MEDICATION Allergy injections     rosuvastatin  (CRESTOR ) 10 MG tablet TAKE 1 TABLET AT BEDTIME 90 tablet 3   sertraline  (ZOLOFT ) 50 MG tablet Take 1 tablet (50 mg total) by mouth daily. 90 tablet 0   topiramate  (TOPAMAX ) 25 MG tablet Take 2 tablets (50 mg total) by mouth daily. 180 tablet 0   zolpidem (AMBIEN) 10 MG tablet      No facility-administered medications prior to visit.     Per HPI unless specifically indicated in ROS section below Review of Systems  Constitutional:  Positive for fatigue. Negative for fever.  HENT:  Positive for sore throat. Negative for congestion.   Eyes:  Negative for pain.  Respiratory:  Positive for cough. Negative for shortness of breath.   Cardiovascular:  Negative for chest pain, palpitations and leg swelling.  Gastrointestinal:  Negative for abdominal pain.  Genitourinary:  Negative for dysuria and vaginal bleeding.  Musculoskeletal:  Positive for myalgias. Negative for back  pain.  Neurological:  Negative for syncope, light-headedness and headaches.  Psychiatric/Behavioral:  Negative for dysphoric mood.    Objective:  BP 124/74   Pulse 79   Temp 98.6 F (37 C) (Oral)   Ht 5\' 3"  (1.6 m)   Wt 205 lb 12.8 oz (93.4 kg)   SpO2 99%   BMI 36.46 kg/m   Wt Readings from Last 3 Encounters:  05/05/24 205 lb 12.8 oz (93.4 kg)  03/23/24 203 lb (92.1 kg)  02/10/24 203 lb (92.1 kg)      Physical Exam Constitutional:      General: She is not in acute distress.    Appearance: She is well-developed. She is not ill-appearing or toxic-appearing.  HENT:     Head: Normocephalic.     Right Ear: Hearing, tympanic membrane, ear canal and external ear normal. Tympanic membrane is not erythematous, retracted or bulging.     Left Ear: Hearing, tympanic membrane, ear  canal and external ear normal. Tympanic membrane is not erythematous, retracted or bulging.     Nose: Mucosal edema present. No rhinorrhea.     Right Sinus: No maxillary sinus tenderness or frontal sinus tenderness.     Left Sinus: No maxillary sinus tenderness or frontal sinus tenderness.     Mouth/Throat:     Pharynx: Uvula midline. Posterior oropharyngeal erythema present.  Eyes:     General: Lids are normal. Lids are everted, no foreign bodies appreciated.     Conjunctiva/sclera: Conjunctivae normal.     Pupils: Pupils are equal, round, and reactive to light.  Neck:     Thyroid : No thyroid  mass or thyromegaly.     Vascular: No carotid bruit.     Trachea: Trachea normal.  Cardiovascular:     Rate and Rhythm: Normal rate and regular rhythm.     Pulses: Normal pulses.     Heart sounds: Normal heart sounds, S1 normal and S2 normal. No murmur heard.    No friction rub. No gallop.  Pulmonary:     Effort: Pulmonary effort is normal. No tachypnea or respiratory distress.     Breath sounds: Normal breath sounds. No decreased breath sounds, wheezing, rhonchi or rales.  Musculoskeletal:     Cervical back: Normal range of motion and neck supple.  Skin:    General: Skin is warm and dry.     Findings: No rash.  Neurological:     Mental Status: She is alert.  Psychiatric:        Mood and Affect: Mood is not anxious or depressed.        Speech: Speech normal.        Behavior: Behavior normal. Behavior is cooperative.        Judgment: Judgment normal.       Results for orders placed or performed in visit on 11/03/23  CMP14+EGFR   Collection Time: 11/03/23 11:14 AM  Result Value Ref Range   Glucose 89 70 - 99 mg/dL   BUN 18 8 - 27 mg/dL   Creatinine, Ser 5.78 0.57 - 1.00 mg/dL   eGFR 95 >46 NG/EXB/2.84   BUN/Creatinine Ratio 26 12 - 28   Sodium 141 134 - 144 mmol/L   Potassium 4.9 3.5 - 5.2 mmol/L   Chloride 103 96 - 106 mmol/L   CO2 20 20 - 29 mmol/L   Calcium  9.9 8.7 - 10.3  mg/dL   Total Protein 6.4 6.0 - 8.5 g/dL   Albumin 4.5 3.9 - 4.9 g/dL   Globulin, Total  1.9 1.5 - 4.5 g/dL   Bilirubin Total 0.4 0.0 - 1.2 mg/dL   Alkaline Phosphatase 61 44 - 121 IU/L   AST 23 0 - 40 IU/L   ALT 18 0 - 32 IU/L  Hemoglobin A1c   Collection Time: 11/03/23 11:14 AM  Result Value Ref Range   Hgb A1c MFr Bld 5.7 (H) 4.8 - 5.6 %   Est. average glucose Bld gHb Est-mCnc 117 mg/dL  Insulin , random   Collection Time: 11/03/23 11:14 AM  Result Value Ref Range   INSULIN  21.7 2.6 - 24.9 uIU/mL  Lipid Panel With LDL/HDL Ratio   Collection Time: 11/03/23 11:14 AM  Result Value Ref Range   Cholesterol, Total 168 100 - 199 mg/dL   Triglycerides 68 0 - 149 mg/dL   HDL 64 >40 mg/dL   VLDL Cholesterol Cal 13 5 - 40 mg/dL   LDL Chol Calc (NIH) 91 0 - 99 mg/dL   LDL/HDL Ratio 1.4 0.0 - 3.2 ratio  VITAMIN D  25 Hydroxy (Vit-D Deficiency, Fractures)   Collection Time: 11/03/23 11:14 AM  Result Value Ref Range   Vit D, 25-Hydroxy 57.0 30.0 - 100.0 ng/mL  TSH   Collection Time: 11/03/23 11:14 AM  Result Value Ref Range   TSH 2.710 0.450 - 4.500 uIU/mL    Assessment and Plan  There are no diagnoses linked to this encounter.  No follow-ups on file.   Herby Lolling, MD

## 2024-05-11 ENCOUNTER — Other Ambulatory Visit: Payer: Self-pay | Admitting: Family Medicine

## 2024-05-11 ENCOUNTER — Encounter: Payer: Medicare HMO | Admitting: Family

## 2024-05-11 ENCOUNTER — Encounter: Payer: Self-pay | Admitting: Family Medicine

## 2024-05-11 MED ORDER — AMOXICILLIN-POT CLAVULANATE 875-125 MG PO TABS: 1.0000 | ORAL_TABLET | Freq: Two times a day (BID) | ORAL | 0 refills | Status: DC

## 2024-05-14 ENCOUNTER — Ambulatory Visit (INDEPENDENT_AMBULATORY_CARE_PROVIDER_SITE_OTHER)
Admission: RE | Admit: 2024-05-14 | Discharge: 2024-05-14 | Disposition: A | Discharge status: Home / Self Care | Source: Ambulatory Visit | Attending: Nurse Practitioner | Admitting: Nurse Practitioner

## 2024-05-14 ENCOUNTER — Ambulatory Visit (INDEPENDENT_AMBULATORY_CARE_PROVIDER_SITE_OTHER): Admitting: Nurse Practitioner

## 2024-05-14 VITALS — BP 112/82 | HR 85 | Temp 97.9°F | Ht 63.0 in | Wt 207.4 lb

## 2024-05-14 DIAGNOSIS — J020 Streptococcal pharyngitis: Secondary | ICD-10-CM | POA: Diagnosis not present

## 2024-05-14 DIAGNOSIS — R051 Acute cough: Secondary | ICD-10-CM

## 2024-05-14 DIAGNOSIS — B379 Candidiasis, unspecified: Secondary | ICD-10-CM | POA: Diagnosis not present

## 2024-05-14 DIAGNOSIS — T3695XA Adverse effect of unspecified systemic antibiotic, initial encounter: Secondary | ICD-10-CM | POA: Diagnosis not present

## 2024-05-14 DIAGNOSIS — R059 Cough, unspecified: Secondary | ICD-10-CM | POA: Diagnosis not present

## 2024-05-14 DIAGNOSIS — J029 Acute pharyngitis, unspecified: Secondary | ICD-10-CM | POA: Diagnosis not present

## 2024-05-14 DIAGNOSIS — R509 Fever, unspecified: Secondary | ICD-10-CM | POA: Diagnosis not present

## 2024-05-14 DIAGNOSIS — R52 Pain, unspecified: Secondary | ICD-10-CM | POA: Diagnosis not present

## 2024-05-14 LAB — POC COVID19 BINAXNOW: SARS Coronavirus 2 Ag: NEGATIVE

## 2024-05-14 LAB — POCT FLU A/B STATUS
Influenza A, POC: NEGATIVE
Influenza B, POC: NEGATIVE

## 2024-05-14 LAB — POCT RAPID STREP A (OFFICE): Rapid Strep A Screen: POSITIVE — AB

## 2024-05-14 MED ORDER — AZITHROMYCIN 250 MG PO TABS: ORAL_TABLET | ORAL | 0 refills | Status: AC

## 2024-05-14 MED ORDER — FLUCONAZOLE 150 MG PO TABS: 150.0000 mg | ORAL_TABLET | Freq: Once | ORAL | 1 refills | Status: AC

## 2024-05-14 MED ORDER — GUAIFENESIN-CODEINE 100-10 MG/5ML PO SOLN: 5.0000 mL | Freq: Three times a day (TID) | ORAL | 0 refills | Status: DC | PRN

## 2024-05-14 NOTE — Assessment & Plan Note (Signed)
 Strep test, COVID test, flu test.  Patient is over-the-counter analgesics as needed rest and drink plenty fluid.

## 2024-05-14 NOTE — Assessment & Plan Note (Signed)
 Will obtain chest x-ray.  COVID and flu test in office.  Will do guaifenesin-codeine cough syrup 5 mL 3 times daily as needed.  Patient precautions reviewed.  Stressed several times in office did not take cough medication and Ambien medication.  Patient knowledge and was agreeable to treatment plan

## 2024-05-14 NOTE — Assessment & Plan Note (Signed)
 COVID and flu test in office.  Patient use over-the-counter analgesics as needed, rest, drink plenty of fluids

## 2024-05-14 NOTE — Assessment & Plan Note (Signed)
 Patient has known strep throat 9 days of the treatment.  Will send off throat culture will do coverage with azithromycin patient has allergy to clarithromycin as tolerated azithromycin just fine.

## 2024-05-14 NOTE — Progress Notes (Signed)
 Acute Office Visit  Subjective:     Patient ID: Mia Burnett, female    DOB: 04-Jan-1956, 68 y.o.   MRN: [id number]  Chief Complaint  Patient presents with   Sore Throat    Tested positive for strep last Wednesday. States she has had a sore throat for 10 days. Changed from amoxicillin  to augmentin . Pt complains of coughing, body aches, tired, breaks out into cold sweat, chills     Patient is in today for sore throat with a history of strep, mVMS, insomnia, prediabetes, vitamin D  def  Patient was seen on 05/05/2024 by amy bedsole, MD for a sore throat. She tested positive for strep and was written amoxicillin . Patient messaged on 05/11/2024 stating that she was feeling bad. The abx was changed form amoxicllin to Augmentin  for 10 days patient is currently 3 days ago  States that she started it Tuesday afternoon   States that she feels terrible. States that she is having fatigue and now has developed a cough  States that she has tried delsym without help  She has done a hot thera flu and it did not help   Review of Systems  Constitutional:  Positive for chills, fever (Subjective) and malaise/fatigue.  HENT:  Positive for sore throat. Negative for ear discharge and ear pain (pressure).   Respiratory:  Positive for cough, sputum production (yellow) and shortness of breath (DOE).   Gastrointestinal:  Positive for diarrhea and nausea. Negative for abdominal pain and vomiting.  Musculoskeletal:  Positive for joint pain and myalgias.  Neurological:  Positive for headaches. Negative for dizziness.        Objective:    BP 112/82   Pulse 85   Temp 97.9 F (36.6 C) (Oral)   Ht 5\' 3"  (1.6 m)   Wt 207 lb 6.4 oz (94.1 kg)   SpO2 98%   BMI 36.74 kg/m  BP Readings from Last 3 Encounters:  05/14/24 112/82  05/05/24 124/74  03/23/24 117/77   Wt Readings from Last 3 Encounters:  05/14/24 207 lb 6.4 oz (94.1 kg)  05/05/24 205 lb 12.8 oz (93.4 kg)  03/23/24 203 lb (92.1 kg)    SpO2 Readings from Last 3 Encounters:  05/14/24 98%  05/05/24 99%  03/23/24 99%      Physical Exam Vitals and nursing note reviewed.  Constitutional:      Appearance: Normal appearance.  HENT:     Right Ear: Tympanic membrane, ear canal and external ear normal.     Left Ear: Tympanic membrane, ear canal and external ear normal.     Nose:     Right Sinus: No maxillary sinus tenderness or frontal sinus tenderness.     Left Sinus: No maxillary sinus tenderness or frontal sinus tenderness.     Mouth/Throat:     Mouth: Mucous membranes are moist.     Pharynx: Oropharynx is clear.  Cardiovascular:     Rate and Rhythm: Normal rate and regular rhythm.     Heart sounds: Normal heart sounds.  Pulmonary:     Effort: Pulmonary effort is normal.     Breath sounds: Normal breath sounds.  Lymphadenopathy:     Cervical: No cervical adenopathy.  Neurological:     Mental Status: She is alert.     No results found for any visits on 05/14/24.      Assessment & Plan:   Problem List Items Addressed This Visit       Respiratory   Strep throat - Primary  Patient has known strep throat 9 days of the treatment.  Will send off throat culture will do coverage with azithromycin patient has allergy to clarithromycin as tolerated azithromycin just fine.      Relevant Medications   azithromycin (ZITHROMAX) 250 MG tablet   fluconazole (DIFLUCAN) 150 MG tablet   Other Relevant Orders   Culture, Group A Strep     Other   Sore throat   Strep test, COVID test, flu test.  Patient is over-the-counter analgesics as needed rest and drink plenty fluid.      Relevant Orders   POCT Flu A & B Status   POC COVID-19 BinaxNow   Acute cough   Will obtain chest x-ray.  COVID and flu test in office.  Will do guaifenesin-codeine cough syrup 5 mL 3 times daily as needed.  Patient precautions reviewed.  Stressed several times in office did not take cough medication and Ambien medication.  Patient  knowledge and was agreeable to treatment plan      Relevant Medications   guaiFENesin-codeine 100-10 MG/5ML syrup   Other Relevant Orders   DG Chest 2 View   POCT Flu A & B Status   POC COVID-19 BinaxNow   Antibiotic-induced yeast infection   Relevant Medications   azithromycin (ZITHROMAX) 250 MG tablet   fluconazole (DIFLUCAN) 150 MG tablet   Body aches   COVID and flu test in office.  Patient use over-the-counter analgesics as needed, rest, drink plenty of fluids      Relevant Orders   POCT Flu A & B Status   POC COVID-19 BinaxNow    Meds ordered this encounter  Medications   azithromycin (ZITHROMAX) 250 MG tablet    Sig: Take 2 tablets on day 1, then 1 tablet daily on days 2 through 5    Dispense:  6 tablet    Refill:  0    Supervising Provider:   Deri Fleet A [[id number]]   guaiFENesin-codeine 100-10 MG/5ML syrup    Sig: Take 5 mLs by mouth 3 (three) times daily as needed.    Dispense:  120 mL    Refill:  0    Supervising Provider:   Deri Fleet A [[id number]]   fluconazole (DIFLUCAN) 150 MG tablet    Sig: Take 1 tablet (150 mg total) by mouth once for 1 dose.    Dispense:  1 tablet    Refill:  1    Supervising Provider:   Deri Fleet A [[id number]]    No follow-ups on file.  Margarie Shay, NP

## 2024-05-16 ENCOUNTER — Encounter: Payer: Self-pay | Admitting: Nurse Practitioner

## 2024-05-16 LAB — CULTURE, GROUP A STREP
Micro Number: 16466093
SPECIMEN QUALITY:: ADEQUATE

## 2024-05-16 NOTE — Progress Notes (Signed)
 To  SUBJECTIVE: Discussed the use of AI scribe software for clinical note transcription with the patient, who gave verbal consent to proceed.  Chief Complaint: Obesity  Interim History: She is down 2 pounds since her last visit  Mia Burnett Burnett is here to discuss her progress with her obesity treatment plan. She is on the Category 2 Plan and states she is following her eating plan approximately 50% of the time. She states she is exercising walking/elliptical/bike/gym 45 minutes 3 times per week.  Mia Burnett Burnett is a 68 year old female who presents for follow-up of her obesity treatment plan.  She is on a category two obesity treatment plan and adheres to it approximately fifty percent of the time. Her physical activity includes walking, going to the gym, and using the elliptical and bike for forty-five minutes three days per week. Her weight has decreased by two pounds since her last visit, totaling a loss of twenty-two pounds overall. She maintains a focus on protein intake, which she believes has contributed to an increase in muscle mass and a decrease in adipose tissue. She drinks plenty of water and enjoys a mango pulp drink mixed with water or in smoothies, which helps her stay hydrated.  She experienced a recent illness initially diagnosed as strep throat, for which she tested positive and was treated with antibiotics. Despite treatment, she continues to feel unwell, experiencing weakness, tiredness, achiness, and a scratchy throat. A recent strep culture was negative for strep A, and she has not had any additional lab work done. She recalls a tick bite in late April, but no further testing has been conducted yet. She is currently taking Augmentin  and Z-Pak, which may be contributing to her gastrointestinal discomfort. She takes a probiotic twice daily to mitigate these effects. No diarrhea and no fever.  Her family history is significant for her Mia Burnett Burnett's recent diagnosis of colon cancer. Her Mia Burnett Burnett,  who is 68 years old, underwent surgery in [place], [place] , and is recommended to undergo chemotherapy. Her Mia Burnett Burnett also has COPD and was placed in intensive care post-surgery due to low oxygen levels. OBJECTIVE: Visit Diagnoses: Problem List Items Addressed This Visit     Prediabetes - Primary   Generalized obesity- Start BMI 39.6   Other Visit Diagnoses       Mixed hyperlipidemia         Strep pharyngitis         BMI 35.0-35.9,adult current BMI 35.6          Strep throat/ Ongoing URI symptoms Mia Burnett Burnett, Mia Burnett by a negative strep A culture. She reports persistent symptoms including weakness, fatigue, myalgia, pharyngitis, and cough for two weeks. She is currently on Augmentin  and azithromycin , which are causing gastrointestinal discomfort. A recent tick bite raises the possibility of Lyme disease, but no rash or fever is present. Viral infections can cause prolonged symptoms, and further evaluation for Lyme disease is advised if symptoms persist. - Complete current course of Augmentin  and azithromycin  - Consider Lyme titer if symptoms persist  Obesity Mia Burnett Burnett is on a category two obesity treatment plan and adheres to it approximately 50% of the time. She engages in regular physical activity, including walking, gym workouts, elliptical, and biking for 45 minutes three days per week. Her weight has decreased by two pounds since the last visit, with a total loss of 22 pounds. Muscle mass has increased by 1.2 pounds, and adipose tissue has decreased by  3.6 pounds. Despite recent illness, she maintains a focus on protein intake, contributing to muscle gain and fat loss. - Continue current exercise regimen - Encourage adherence to the obesity treatment plan - Consider meal prep options like [employer]als and [employer] prepared meats for convenience and nutritional support  Prediabetes with cravings Last A1c was  5.7/insulin  21.7- not at goals  Medication(s): Topiramate  25-50  mg nightly Polyphagia:No Lab Results  Component Value Date   HGBA1C 5.7 (H) 11/03/2023   HGBA1C 5.6 07/29/2023   HGBA1C 5.8 02/04/2023   HGBA1C 6.0 07/31/2022   Lab Results  Component Value Date   INSULIN  21.7 11/03/2023   INSULIN  28.1 (H) 04/30/2023    Plan: Continue topiramate . Continue working on nutrition plan to decrease simple carbohydrates, increase lean proteins and exercise to promote weight loss, improve glycemic control and prevent progression to Type 2 diabetes.    Follow-up Mia Burnett Burnett is scheduled for follow-up appointments to monitor her progress and address ongoing health concerns. - Schedule follow-up appointment on June 16th at 4:30 PM - Schedule subsequent follow-up appointment on July 14th at 4:30 PM Vitals Temp: 98.1 F (36.7 C) BP: 116/75 Pulse Rate: 66 SpO2: 99 %   Anthropometric Measurements Height: 5\' 3"  (1.6 m) Weight: 201 lb (91.2 kg) BMI (Calculated): 35.61 Weight at Last Visit: 203 lb Weight Lost Since Last Visit: 2 lb Weight Gained Since Last Visit: 0 Starting Weight: 223 lb Total Weight Loss (lbs): 22 lb (9.979 kg)   Body Composition  Body Fat %: 45.7 % Fat Mass (lbs): 92 lbs Muscle Mass (lbs): 103.6 lbs Total Body Water (lbs): 74.4 lbs Visceral Fat Rating : 14   Other Clinical Data Fasting: No Labs: No Today's Visit #: 11 Starting Date: 05/01/23     ASSESSMENT AND PLAN:  Diet: Mia Burnett Burnett is currently in the action stage of change. As such, her goal is to continue with weight loss efforts. She has agreed to Category 2 Plan.  Exercise: Mia Burnett Burnett has been instructed to work up to a goal of 150 minutes of combined cardio and strengthening exercise per week for weight loss and overall health benefits.   Behavior Modification:  We discussed the following Behavioral Modification Strategies today: increasing lean protein intake, decreasing simple carbohydrates, increasing  vegetables, increase H2O intake, increase high fiber foods, no skipping meals, meal planning and cooking strategies, avoiding temptations, and planning for success. We discussed various medication options to help Mia Burnett Burnett with her weight loss efforts and we both agreed to continue to work on nutritional and behavioral strategies to promote weight loss.  .  Return in about 4 weeks (around 06/14/2024).Aaron Aas She was informed of the importance of frequent follow up visits to maximize her success with intensive lifestyle modifications for her multiple health conditions.  Attestation Statements:   Reviewed by clinician on day of visit: allergies, medications, problem list, medical history, surgical history, family history, social history, and previous encounter notes.   Time spent on visit including pre-visit chart review and post-visit care and charting was 34 minutes.    Kristyna Bradstreet, PA-C

## 2024-05-17 ENCOUNTER — Ambulatory Visit (INDEPENDENT_AMBULATORY_CARE_PROVIDER_SITE_OTHER): Admitting: Physician Assistant

## 2024-05-17 ENCOUNTER — Encounter (INDEPENDENT_AMBULATORY_CARE_PROVIDER_SITE_OTHER): Payer: Self-pay | Admitting: Physician Assistant

## 2024-05-17 ENCOUNTER — Ambulatory Visit: Payer: Self-pay | Admitting: Nurse Practitioner

## 2024-05-17 VITALS — BP 116/75 | HR 66 | Temp 98.1°F | Ht 63.0 in | Wt 201.0 lb

## 2024-05-17 DIAGNOSIS — Z6835 Body mass index (BMI) 35.0-35.9, adult: Secondary | ICD-10-CM

## 2024-05-17 DIAGNOSIS — E782 Mixed hyperlipidemia: Secondary | ICD-10-CM

## 2024-05-17 DIAGNOSIS — J020 Streptococcal pharyngitis: Secondary | ICD-10-CM

## 2024-05-17 DIAGNOSIS — E669 Obesity, unspecified: Secondary | ICD-10-CM | POA: Diagnosis not present

## 2024-05-17 DIAGNOSIS — R7303 Prediabetes: Secondary | ICD-10-CM | POA: Diagnosis not present

## 2024-05-17 DIAGNOSIS — N951 Menopausal and female climacteric states: Secondary | ICD-10-CM

## 2024-05-21 DIAGNOSIS — J301 Allergic rhinitis due to pollen: Secondary | ICD-10-CM | POA: Diagnosis not present

## 2024-05-21 DIAGNOSIS — J3089 Other allergic rhinitis: Secondary | ICD-10-CM | POA: Diagnosis not present

## 2024-05-21 DIAGNOSIS — J4520 Mild intermittent asthma, uncomplicated: Secondary | ICD-10-CM | POA: Diagnosis not present

## 2024-05-21 DIAGNOSIS — J40 Bronchitis, not specified as acute or chronic: Secondary | ICD-10-CM | POA: Diagnosis not present

## 2024-06-05 ENCOUNTER — Ambulatory Visit (HOSPITAL_COMMUNITY)
Admission: EM | Admit: 2024-06-05 | Discharge: 2024-06-05 | Disposition: A | Discharge status: Home / Self Care | Attending: Emergency Medicine | Admitting: Emergency Medicine

## 2024-06-05 ENCOUNTER — Encounter (HOSPITAL_COMMUNITY): Payer: Self-pay | Admitting: Emergency Medicine

## 2024-06-05 ENCOUNTER — Ambulatory Visit (INDEPENDENT_AMBULATORY_CARE_PROVIDER_SITE_OTHER)

## 2024-06-05 DIAGNOSIS — B9689 Other specified bacterial agents as the cause of diseases classified elsewhere: Secondary | ICD-10-CM

## 2024-06-05 DIAGNOSIS — R052 Subacute cough: Secondary | ICD-10-CM

## 2024-06-05 DIAGNOSIS — R519 Headache, unspecified: Secondary | ICD-10-CM | POA: Diagnosis not present

## 2024-06-05 DIAGNOSIS — J019 Acute sinusitis, unspecified: Secondary | ICD-10-CM

## 2024-06-05 DIAGNOSIS — R059 Cough, unspecified: Secondary | ICD-10-CM | POA: Diagnosis not present

## 2024-06-05 LAB — SEDIMENTATION RATE: Sed Rate: 12 mm/h (ref 0–22)

## 2024-06-05 LAB — CBC WITH DIFFERENTIAL/PLATELET
Abs Immature Granulocytes: 0.04 10*3/uL (ref 0.00–0.07)
Basophils Absolute: 0 10*3/uL (ref 0.0–0.1)
Basophils Relative: 0 %
Eosinophils Absolute: 0.2 10*3/uL (ref 0.0–0.5)
Eosinophils Relative: 2 %
HCT: 41.8 % (ref 36.0–46.0)
Hemoglobin: 14 g/dL (ref 12.0–15.0)
Immature Granulocytes: 0 %
Lymphocytes Relative: 36 %
Lymphs Abs: 3.4 10*3/uL (ref 0.7–4.0)
MCH: 28.3 pg (ref 26.0–34.0)
MCHC: 33.5 g/dL (ref 30.0–36.0)
MCV: 84.6 fL (ref 80.0–100.0)
Monocytes Absolute: 0.8 10*3/uL (ref 0.1–1.0)
Monocytes Relative: 9 %
Neutro Abs: 5 10*3/uL (ref 1.7–7.7)
Neutrophils Relative %: 53 %
Platelets: UNDETERMINED 10*3/uL (ref 150–400)
RBC: 4.94 MIL/uL (ref 3.87–5.11)
RDW: 14.1 % (ref 11.5–15.5)
Smear Review: UNDETERMINED
WBC: 9.4 10*3/uL (ref 4.0–10.5)
nRBC: 0 % (ref 0.0–0.2)

## 2024-06-05 LAB — COMPREHENSIVE METABOLIC PANEL WITH GFR
ALT: 25 U/L (ref 0–44)
AST: 34 U/L (ref 15–41)
Albumin: 4.1 g/dL (ref 3.5–5.0)
Alkaline Phosphatase: 54 U/L (ref 38–126)
Anion gap: 11 (ref 5–15)
BUN: 23 mg/dL (ref 8–23)
CO2: 22 mmol/L (ref 22–32)
Calcium: 9.3 mg/dL (ref 8.9–10.3)
Chloride: 106 mmol/L (ref 98–111)
Creatinine, Ser: 0.88 mg/dL (ref 0.44–1.00)
GFR, Estimated: >60 mL/min (ref >60)
Glucose, Bld: 85 mg/dL (ref 70–99)
Potassium: 4.5 mmol/L (ref 3.5–5.1)
Sodium: 139 mmol/L (ref 135–145)
Total Bilirubin: 1.1 mg/dL (ref 0.0–1.2)
Total Protein: 7 g/dL (ref 6.5–8.1)

## 2024-06-05 LAB — C-REACTIVE PROTEIN: CRP: 0.6 mg/dL (ref <1.0)

## 2024-06-05 MED ORDER — FLUCONAZOLE 150 MG PO TABS: 150.0000 mg | ORAL_TABLET | Freq: Every day | ORAL | 0 refills | Status: DC

## 2024-06-05 MED ORDER — BENZONATATE 200 MG PO CAPS: 200.0000 mg | ORAL_CAPSULE | Freq: Three times a day (TID) | ORAL | 0 refills | Status: DC

## 2024-06-05 MED ORDER — DOXYCYCLINE HYCLATE 100 MG PO CAPS: 100.0000 mg | ORAL_CAPSULE | Freq: Two times a day (BID) | ORAL | 0 refills | Status: DC

## 2024-06-05 NOTE — ED Notes (Signed)
 Pt came out of room reporting she is feeling much better and ready to go home.

## 2024-06-05 NOTE — ED Provider Notes (Signed)
 [facility]    CSN: [id number] Arrival date & time: 06/05/24  1549      History   Chief Complaint Chief Complaint  Patient presents with   Cough    HPI Mia Burnett is a 68 y.o. female.   Patient presents to clinic for concern of an ongoing cough for the past 6 weeks.  She has been seen multiple times by her primary care provider as well as her allergy and asthma provider.  Has been having headaches with her coughing fits as well as frontal sinus pressure.  Endorses postnasal drip.  Has been taking Mucinex  without much improvement.  Initially at the beginning of May she had tested positive for strep and a total of 3 times.  Was treated with amoxicillin , Augmentin  and azithromycin .  Her allergist treated her with a steroid injection and steroid taper as she was having some wheezing and shortness of breath.  Has been using her albuterol inhaler.  Denies history of asthma, only has asthma with her upper respiratory infections.  Has felt fatigued.  Occasionally cough will be productive, but for the most part it is dry.  Does have a history of acid reflux and has been taking her omeprazole  as prescribed.  Did try the cough syrup that her primary care provider sent in, this made her drowsy and gave her terrible dreams.  Has had success with Tessalon Perles in the past.  Denies any rashes.  The history is provided by the patient and medical records.  Cough   Past Medical History:  Diagnosis Date   Allergies    Gallbladder problem    Hyperlipidemia    Prediabetes     Patient Active Problem List   Diagnosis Date Noted   Acute cough 05/14/2024   Antibiotic-induced yeast infection 05/14/2024   Body aches 05/14/2024   Sore throat 05/05/2024   Prediabetes 11/03/2023   Vitamin D  deficiency 11/03/2023   Other fatigue 11/03/2023   Generalized obesity- Start BMI 39.6 11/03/2023   BMI 36.0-36.9,adult Current BMI 36.4 11/03/2023   Class 2 obesity due to excess calories  without serious comorbidity with body mass index (BMI) of 37.0 to 37.9 in adult 07/29/2023   Strep throat 07/29/2023   GERD without esophagitis 01/28/2023   Hot flashes due to menopause 07/30/2022   Other hyperlipidemia 07/26/2022   Sleep disorder 07/26/2022   Insomnia 01/31/2022   Systolic murmur 01/30/2022   Trochanteric bursitis of left hip 07/26/2019   Plantar fasciitis 01/29/2016   Allergic rhinitis 10/10/2008    Past Surgical History:  Procedure Laterality Date   CHOLECYSTECTOMY  1993   REDUCTION MAMMAPLASTY Bilateral    2003   ROTATOR CUFF REPAIR Right    2016   SHOULDER SURGERY Left    2005    OB History     Gravida  1   Para      Term      Preterm      AB      Living         SAB      IAB      Ectopic      Multiple      Live Births               Home Medications    Prior to Admission medications   Medication Sig Start Date End Date Taking? Authorizing Provider  benzonatate (TESSALON) 200 MG capsule Take 1 capsule (200 mg total) by mouth every 8 (eight) hours.  06/05/24  Yes Robie Mcniel  N, FNP  doxycycline (VIBRAMYCIN) 100 MG capsule Take 1 capsule (100 mg total) by mouth 2 (two) times daily. 06/05/24  Yes Tilmon Wisehart  N, FNP  fluconazole  (DIFLUCAN ) 150 MG tablet Take 1 tablet (150 mg total) by mouth daily. 06/05/24  Yes Gayland Nicol  N, FNP  albuterol (VENTOLIN HFA) 108 (90 Base) MCG/ACT inhaler Inhale into the lungs. 06/10/22   [provider]  amoxicillin -clavulanate (AUGMENTIN ) 875-125 MG tablet Take 1 tablet by mouth 2 (two) times daily. 05/11/24   Bedsole, Amy E, MD  Calcium  Citrate (CITRACAL PO) Take 800 mg by mouth.    [provider]  Cholecalciferol (VITAMIN D3) 50 MCG (2000 UT) TABS Take by mouth.    [provider]  Coenzyme Q10 (COQ10) 200 MG CAPS Take by mouth.    [provider]  EPINEPHrine 0.3 mg/0.3 mL IJ SOAJ injection AUTO-INJECTOR INTO OUTER THIGH INJECTION AS NEEDED FOR  ANAPHYLAXIS. 09/24/21   [provider]  fluticasone Odis Bennetts ALLERGY RELIEF) 50 MCG/ACT nasal spray 2 sprays 12/04/10   [provider]  guaiFENesin -codeine  100-10 MG/5ML syrup Take 5 mLs by mouth 3 (three) times daily as needed. 05/14/24   Dorothe Gaster, NP  montelukast (SINGULAIR) 10 MG tablet  06/15/15   [provider]  Multiple Vitamin (MULTIVITAMIN) capsule Take 1 capsule by mouth daily.    [provider]  omeprazole  (PRILOSEC) 20 MG capsule TAKE 1 CAPSULE EVERY DAY 09/02/23   Felicita Horns, FNP  PRESCRIPTION MEDICATION Allergy injections    [provider]  rosuvastatin  (CRESTOR ) 10 MG tablet TAKE 1 TABLET AT BEDTIME 04/12/24   Felicita Horns, FNP  sertraline  (ZOLOFT ) 50 MG tablet Take 1 tablet (50 mg total) by mouth daily. 03/23/24   Rayburn, Evangelina Hilt, PA-C  topiramate  (TOPAMAX ) 25 MG tablet Take 2 tablets (50 mg total) by mouth daily. 03/23/24   Rayburn, Evangelina Hilt, PA-C  zolpidem (AMBIEN) 10 MG tablet  07/03/15   [provider]    Family History Family History  Problem Relation Age of Onset   Cancer Mother    Breast cancer Mother 40   Obesity Mother    Early death Maternal Grandmother    Early death Maternal Grandfather    Healthy Half-Brother    Breast cancer Half-Sister    Breast cancer Half-Sister    Breast cancer Half-Sister     Social History Social History   Tobacco Use   Smoking status: Never   Smokeless tobacco: Never  Vaping Use   Vaping status: Never Used  Substance Use Topics   Alcohol use: Yes    Comment: 1 glass of wine every 2-3 weeks   Drug use: Never     Allergies   Other, Sulfonamide derivatives, Sulfur, and Clarithromycin   Review of Systems Review of Systems  Per HPI  Physical Exam Triage Vital Signs ED Triage Vitals [06/05/24 1652]  Encounter Vitals Group     BP (!) 150/79     Systolic BP Percentile      Diastolic BP Percentile      Pulse Rate 74     Resp 18     Temp  (!) 97.3 F (36.3 C)     Temp Source Oral     SpO2 99 %     Weight      Height      Head Circumference      Peak Flow      Pain Score 0     Pain  Loc      Pain Education      Exclude from Growth Chart    No data found.  Updated Vital Signs BP (!) 150/79 (BP Location: Left Arm)   Pulse 74   Temp (!) 97.3 F (36.3 C) (Oral)   Resp 18   SpO2 99%   Visual Acuity Right Eye Distance:   Left Eye Distance:   Bilateral Distance:    Right Eye Near:   Left Eye Near:    Bilateral Near:     Physical Exam Vitals and nursing note reviewed.  Constitutional:      Appearance: Normal appearance.  HENT:     Head: Normocephalic and atraumatic.     Right Ear: External ear normal.     Left Ear: External ear normal.     Nose: Nose normal.     Mouth/Throat:     Mouth: Mucous membranes are moist.  Eyes:     Conjunctiva/sclera: Conjunctivae normal.  Cardiovascular:     Rate and Rhythm: Normal rate and regular rhythm.     Heart sounds: Normal heart sounds. No murmur heard. Pulmonary:     Effort: Pulmonary effort is normal. No respiratory distress.     Breath sounds: Normal breath sounds. No wheezing.  Skin:    General: Skin is warm and dry.  Neurological:     General: No focal deficit present.     Mental Status: She is alert.  Psychiatric:        Mood and Affect: Mood normal.      UC Treatments / Results  Labs (all labs ordered are listed, but only abnormal results are displayed) Labs Reviewed  CBC WITH DIFFERENTIAL/PLATELET  COMPREHENSIVE METABOLIC PANEL WITH GFR  C-REACTIVE PROTEIN  SEDIMENTATION RATE    EKG   Radiology DG Chest 2 View Result Date: 06/05/2024 CLINICAL DATA:  Cough for 6 weeks. EXAM: CHEST - 2 VIEW COMPARISON:  05/14/2024 FINDINGS: The heart size and mediastinal contours are within normal limits. Both lungs are clear. Stable old midthoracic vertebral body compression deformity again noted. IMPRESSION: No active cardiopulmonary disease. Electronically  Signed   By: Marlyce Sine M.D.   On: 06/05/2024 17:20    Procedures Procedures (including critical care time)  Medications Ordered in UC Medications - No data to display  Initial Impression / Assessment and Plan / UC Course  I have reviewed the triage vital signs and the nursing notes.  Pertinent labs & imaging results that were available during my care of the patient were reviewed by me and considered in my medical decision making (see chart for details).  Vitals in triage reviewed, patient is hemodynamically stable.  Lungs are vesicular, heart with regular rate and rhythm.  Frontal sinus pressure with postnasal drip on exam.  Chest x-ray by my interpretation does not show obvious infiltrate, confirmed with radiology overread.  Concern for bacterial sinusitis with continued duration and frontal sinus pressure as well as dental pain.  Will cover with doxycycline due to recent amoxicillin , Augmentin  and azithromycin .  Will check some lab work due to duration of symptoms and staff will contact if urgent follow-up is needed.  Plan of care, follow-up care return precautions given, no questions at this time.     Final Clinical Impressions(s) / UC Diagnoses   Final diagnoses:  Subacute cough  Bad headache  Acute bacterial sinusitis     Discharge Instructions      Sometimes a postviral cough can last for up to 6 weeks.  You  can use the Tessalon Perles every 8 hours as needed to help with your cough.  Continue to use your albuterol inhaler as needed.  Start the antibiotics and take these twice daily with food until finished.  You can take your probiotic daily and the Diflucan  if needed.  We have checked some basic lab work and we will contact you if anything results as urgently abnormal.  If you do not feel better over the next 3 days, please follow-up with your primary care provider as further evaluation is needed.   ED Prescriptions     Medication Sig Dispense Auth. Provider    doxycycline (VIBRAMYCIN) 100 MG capsule Take 1 capsule (100 mg total) by mouth 2 (two) times daily. 20 capsule Harlow Lighter, Gregary Blackard  N, FNP   benzonatate (TESSALON) 200 MG capsule Take 1 capsule (200 mg total) by mouth every 8 (eight) hours. 30 capsule Harlow Lighter, Jemuel Laursen  N, FNP   fluconazole  (DIFLUCAN ) 150 MG tablet Take 1 tablet (150 mg total) by mouth daily. 1 tablet Violet Grew, FNP      PDMP not reviewed this encounter.   Harlow Lighter, Marshawn Ninneman  N, FNP 06/05/24 1742

## 2024-06-05 NOTE — ED Triage Notes (Signed)
 Pt had cough for 6 weeks. Reports 6 weeks ago had chest xray that was normal. Saw her PCP and had amoxicillin , Augmentin , Zpak, and codeine  cough syrup, Mucinex , Alka seltzer night time medicine. Saw her allergist who prescribed 6 day supply of prednisone and gave steroid shot.  Reports cough is sometimes productive with yellow mucous. Reports chest pain and head ache when coughs.

## 2024-06-05 NOTE — ED Notes (Signed)
 Pt got hot, clammy and had near syncopal episode when getting her blood draw. Pt laid back in treatment chair, staff fanning patient and getting her ice water.

## 2024-06-05 NOTE — Discharge Instructions (Addendum)
 Sometimes a postviral cough can last for up to 6 weeks.  You can use the Tessalon Perles every 8 hours as needed to help with your cough.  Continue to use your albuterol inhaler as needed.  Start the antibiotics and take these twice daily with food until finished.  You can take your probiotic daily and the Diflucan  if needed.  We have checked some basic lab work and we will contact you if anything results as urgently abnormal.  If you do not feel better over the next 3 days, please follow-up with your primary care provider as further evaluation is needed.

## 2024-06-07 ENCOUNTER — Other Ambulatory Visit (INDEPENDENT_AMBULATORY_CARE_PROVIDER_SITE_OTHER): Payer: Self-pay | Admitting: Physician Assistant

## 2024-06-07 ENCOUNTER — Ambulatory Visit (HOSPITAL_COMMUNITY): Payer: Self-pay | Admitting: Emergency Medicine

## 2024-06-07 DIAGNOSIS — N951 Menopausal and female climacteric states: Secondary | ICD-10-CM

## 2024-06-08 DIAGNOSIS — Z124 Encounter for screening for malignant neoplasm of cervix: Secondary | ICD-10-CM | POA: Diagnosis not present

## 2024-06-08 DIAGNOSIS — Z6836 Body mass index (BMI) 36.0-36.9, adult: Secondary | ICD-10-CM | POA: Diagnosis not present

## 2024-06-11 ENCOUNTER — Other Ambulatory Visit: Payer: Self-pay | Admitting: Obstetrics and Gynecology

## 2024-06-11 DIAGNOSIS — Z9189 Other specified personal risk factors, not elsewhere classified: Secondary | ICD-10-CM

## 2024-06-13 NOTE — Progress Notes (Unsigned)
 SUBJECTIVE: Discussed the use of AI scribe software for clinical note transcription with the patient, who gave verbal consent to proceed.  Chief Complaint: Obesity  Interim History: She is up 2 lbs since her last visit.' Down 20 lbs overall TBW loss of ~9 %  Mia Burnett is here to discuss her progress with her obesity treatment plan. She is on the Category 2 Plan and states she is following her eating plan approximately 50-75 % of the time. She states she is walking for exercise 40 minutes 2 times per week. Mia Burnett is a 68 year old female with obesity who presents for a follow-up on her obesity treatment plan.  She has gained two pounds since her last visit and is currently taking topiramate  50 mg daily for weight management. She is also on doxycycline  for a sinus infection, with two days remaining in the course. Her cough has improved, but she continues to experience a sore tongue and stomach pain. Poor sleep is a concern, which she believes is affecting her weight.  She has been adhering to increased water intake to mitigate medication side effects and prevent illness. She spends time outdoors and uses extra sunscreen due to having a pool. Her diet has been bland, mainly consisting of salt, rice, and carbohydrates, due to the antibiotics. She recently celebrated her granddaughter's high school graduation and consumed a small steak over two meals.  She is managing cravings and is focused on her diet, incorporating mushroom omelets and Mia Burnett meals, which she finds convenient. She is concerned about her prediabetes and plans to have her A1c and insulin  levels checked in August during a fasting morning appointment.  Recent blood work showed normal electrolytes, liver, and kidney function.  OBJECTIVE: Visit Diagnoses: Problem List Items Addressed This Visit     Prediabetes - Primary   Relevant Medications   topiramate  (TOPAMAX ) 25 MG tablet   Generalized obesity- Start BMI 39.6    BMI 36.0-36.9,adult Current BMI 36.4   Other Visit Diagnoses       Mixed hyperlipidemia          Obesity She has gained two pounds since her last visit and is currently on topiramate  50 mg daily for weight management. She reports difficulty sleeping, which may be impacting her weight, and is experiencing cravings. Concerns about her diet while on antibiotics were noted. The potential use of metformin for insulin  resistance and prediabetes was discussed, with consideration of gastrointestinal side effects due to recent antibiotic use. - Continue topiramate  50 mg daily - Increase topiramate  to 75 mg next week if tolerated - Monitor for side effects such as grogginess and adjust dose if necessary - Consider metformin for insulin  resistance and prediabetes after gastrointestinal recovery - Encourage protein intake and avoid under-eating - Discuss dietary options such as frittatas and Mia Burnett meals  Prediabetes with cravings Last A1c was 5.7- not at goal . Insulin  21.7- not at goal of < 5.   Medication(s): Topiramate  50 mg nightly Reports no SE with current topiramate  dose.  Polyphagia:No Lab Results  Component Value Date   HGBA1C 5.7 (H) 11/03/2023   HGBA1C 5.6 07/29/2023   HGBA1C 5.8 02/04/2023   HGBA1C 6.0 07/31/2022   Lab Results  Component Value Date   INSULIN  21.7 11/03/2023   INSULIN  28.1 (H) 04/30/2023    Plan: Continue and increase dose Topiramate  75 mg nightly.  She is still recovering from a severe sinus infection/completing antibiotics at this time, but we did discuss possible addition of  metformin for insulin  resistance/prediabetes management if topiramate  is not effective or she has any side effects with the increased dose. Continue working on nutrition plan to decrease simple carbohydrates, increase lean proteins and exercise to promote weight loss, improve glycemic control and prevent progression to Type 2 diabetes.   Sinus Infection She is on doxycycline  for a  sinus infection and reports improvement in symptoms, though she still has a cough. She experiences side effects from the antibiotics, including a sore tongue and stomach discomfort. Potential thrush as a side effect of antibiotics was discussed. She was treated with diflucan  for yeast vaginitis already and may require further care if tongue soreness does not resolve quickly.  - Complete doxycycline  course - Consider apple cider vinegar swishes for sore tongue - Monitor for signs of thrush and consider additional antifungal treatment if needed  Follow-up She is scheduled for a follow-up appointment to monitor her progress with weight management and prediabetes/insulin  resistance with cravings. . Blood work will be conducted to assess A1c and insulin  levels. The importance of fasting for accurate results was emphasized. - Schedule follow-up appointment on August 13th at 8:30 AM- with fasting labs at that visit.    Vitals Temp: 97.9 F (36.6 C) BP: 129/77 Pulse Rate: 65 SpO2: 99 %   Anthropometric Measurements Height: 5' 3 (1.6 m) Weight: 203 lb (92.1 kg) BMI (Calculated): 35.97 Weight at Last Visit: 201 lb Weight Lost Since Last Visit: 0 Weight Gained Since Last Visit: 2 lb Starting Weight: 223 lb Total Weight Loss (lbs): 20 lb (9.072 kg)   Body Composition  Body Fat %: 47 % Fat Mass (lbs): 95.6 lbs Muscle Mass (lbs): 102.4 lbs Total Body Water (lbs): 80 lbs Visceral Fat Rating : 14   Other Clinical Data Fasting: No Labs: No Today's Visit #: 12 Starting Date: 05/01/23     ASSESSMENT AND PLAN:  Diet: Mia Burnett is currently in the action stage of change. As such, her goal is to continue with weight loss efforts. She has agreed to Category 2 Plan.  Exercise: Mia Burnett has been instructed to work up to a goal of 150 minutes of combined cardio and strengthening exercise per week for weight loss and overall health benefits.   Behavior Modification:  We discussed the following  Behavioral Modification Strategies today: increasing lean protein intake, decreasing simple carbohydrates, increasing vegetables, increase H2O intake, increase high fiber foods, meal planning and cooking strategies, emotional eating strategies , avoiding temptations, and planning for success. We discussed various medication options to help Mia Burnett with her weight loss efforts and we both agreed to increase topiramate  to 75 mg nightly and monitor response to cravings and continue to work on nutritional and behavioral strategies to promote weight loss.  .  Return in about 4 weeks (around 07/12/2024).Aaron Aas She was informed of the importance of frequent follow up visits to maximize her success with intensive lifestyle modifications for her multiple health conditions.  Attestation Statements:   Reviewed by clinician on day of visit: allergies, medications, problem list, medical history, surgical history, family history, social history, and previous encounter notes.   Time spent on visit including pre-visit chart review and post-visit care and charting was 28 minutes.    Mia Klassen, PA-C

## 2024-06-14 ENCOUNTER — Encounter (INDEPENDENT_AMBULATORY_CARE_PROVIDER_SITE_OTHER): Payer: Self-pay | Admitting: Physician Assistant

## 2024-06-14 ENCOUNTER — Ambulatory Visit (INDEPENDENT_AMBULATORY_CARE_PROVIDER_SITE_OTHER): Admitting: Physician Assistant

## 2024-06-14 VITALS — BP 129/77 | HR 65 | Temp 97.9°F | Ht 63.0 in | Wt 203.0 lb

## 2024-06-14 DIAGNOSIS — Z6836 Body mass index (BMI) 36.0-36.9, adult: Secondary | ICD-10-CM | POA: Diagnosis not present

## 2024-06-14 DIAGNOSIS — B9789 Other viral agents as the cause of diseases classified elsewhere: Secondary | ICD-10-CM

## 2024-06-14 DIAGNOSIS — E669 Obesity, unspecified: Secondary | ICD-10-CM

## 2024-06-14 DIAGNOSIS — E559 Vitamin D deficiency, unspecified: Secondary | ICD-10-CM

## 2024-06-14 DIAGNOSIS — R7303 Prediabetes: Secondary | ICD-10-CM | POA: Diagnosis not present

## 2024-06-14 DIAGNOSIS — J019 Acute sinusitis, unspecified: Secondary | ICD-10-CM | POA: Diagnosis not present

## 2024-06-14 DIAGNOSIS — E782 Mixed hyperlipidemia: Secondary | ICD-10-CM | POA: Diagnosis not present

## 2024-06-14 MED ORDER — TOPIRAMATE 25 MG PO TABS: 75.0000 mg | ORAL_TABLET | Freq: Every day | ORAL | 0 refills | Status: DC

## 2024-06-16 ENCOUNTER — Other Ambulatory Visit: Payer: Self-pay | Admitting: Family

## 2024-06-16 DIAGNOSIS — K219 Gastro-esophageal reflux disease without esophagitis: Secondary | ICD-10-CM

## 2024-06-30 ENCOUNTER — Encounter: Payer: Self-pay | Admitting: Obstetrics and Gynecology

## 2024-07-01 ENCOUNTER — Encounter: Payer: Self-pay | Admitting: Internal Medicine

## 2024-07-01 ENCOUNTER — Ambulatory Visit (INDEPENDENT_AMBULATORY_CARE_PROVIDER_SITE_OTHER): Admitting: Internal Medicine

## 2024-07-01 ENCOUNTER — Ambulatory Visit: Payer: Self-pay | Admitting: Internal Medicine

## 2024-07-01 VITALS — BP 114/70 | HR 98 | Temp 98.0°F | Resp 20 | Ht 63.0 in | Wt 202.4 lb

## 2024-07-01 DIAGNOSIS — R051 Acute cough: Secondary | ICD-10-CM | POA: Diagnosis not present

## 2024-07-01 DIAGNOSIS — B349 Viral infection, unspecified: Secondary | ICD-10-CM | POA: Diagnosis not present

## 2024-07-01 DIAGNOSIS — R519 Headache, unspecified: Secondary | ICD-10-CM | POA: Diagnosis not present

## 2024-07-01 LAB — POC COVID19 BINAXNOW: SARS Coronavirus 2 Ag: NEGATIVE

## 2024-07-01 NOTE — Progress Notes (Signed)
 Subjective:    Patient ID: Mia Burnett, female    DOB: [birth year], 68 y.o.   MRN: [id number]  HPI Here due to headache/respiratory symptoms  Started earlier this week with sore throat and runny nose This is some better--but now has had bad headache ---now the 3rd day Using advil/tylenol (dual action) Ongoing cough since May---overall better lately Now hurts in upper back with the cough though Now aching all over today--was mild yesterday  No fever Slight chills --no sweats No SOB Headache is frontal and worse with bending Some post nasal drip No ear pain  On singulair and xyzal Also on allergy injections weekly  Current Outpatient Medications on File Prior to Visit  Medication Sig Dispense Refill   omeprazole  (PRILOSEC) 20 MG capsule TAKE 1 CAPSULE EVERY DAY 90 capsule 0   albuterol (VENTOLIN HFA) 108 (90 Base) MCG/ACT inhaler Inhale into the lungs.     Calcium  Citrate (CITRACAL PO) Take 800 mg by mouth.     Cholecalciferol (VITAMIN D3) 50 MCG (2000 UT) TABS Take by mouth.     Coenzyme Q10 (COQ10) 200 MG CAPS Take by mouth.     EPINEPHrine 0.3 mg/0.3 mL IJ SOAJ injection AUTO-INJECTOR INTO OUTER THIGH INJECTION AS NEEDED FOR ANAPHYLAXIS.     fluconazole  (DIFLUCAN ) 150 MG tablet Take 1 tablet (150 mg total) by mouth daily. 1 tablet 0   fluticasone (FLONASE ALLERGY RELIEF) 50 MCG/ACT nasal spray 2 sprays     montelukast (SINGULAIR) 10 MG tablet      Multiple Vitamin (MULTIVITAMIN) capsule Take 1 capsule by mouth daily.     PRESCRIPTION MEDICATION Allergy injections     rosuvastatin  (CRESTOR ) 10 MG tablet TAKE 1 TABLET AT BEDTIME 90 tablet 3   sertraline  (ZOLOFT ) 50 MG tablet Take 1 tablet (50 mg total) by mouth daily. 90 tablet 0   topiramate  (TOPAMAX ) 25 MG tablet Take 3 tablets (75 mg total) by mouth daily. 180 tablet 0   zolpidem (AMBIEN) 10 MG tablet      No current facility-administered medications on file prior to visit.    Allergies  Allergen Reactions    Other Itching   Sulfonamide Derivatives Other (See Comments)   Sulfur Other (See Comments)   Clarithromycin Other (See Comments)    Abdominal pain     Past Medical History:  Diagnosis Date   Allergies    Gallbladder problem    Hyperlipidemia    Prediabetes     Past Surgical History:  Procedure Laterality Date   CHOLECYSTECTOMY  1993   REDUCTION MAMMAPLASTY Bilateral    2003   ROTATOR CUFF REPAIR Right    2016   SHOULDER SURGERY Left    2005    Family History  Problem Relation Age of Onset   Cancer Mother    Breast cancer Mother 86   Obesity Mother    Early death Maternal Grandmother    Early death Maternal Grandfather    Healthy Half-Brother    Breast cancer Half-Sister    Breast cancer Half-Sister    Breast cancer Half-Sister     Social History   Socioeconomic History   Marital status: Widowed    Spouse name: Not on file   Number of children: 1   Years of education: Not on file   Highest education level: Not on file  Occupational History    Employer: [employer]    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 [employer]  Tobacco Use   Smoking status: Never  Smokeless tobacco: Never  Vaping Use   Vaping status: Never Used  Substance and Sexual Activity   Alcohol use: Yes    Comment: 1 glass of wine every 2-3 weeks   Drug use: Never   Sexual activity: Not Currently    Partners: Male  Other Topics Concern   Not on file  Social History Narrative   One daughter age 63   Social Drivers of Corporate investment banker Strain: Not on file  Food Insecurity: Not on file  Transportation Needs: Not on file  Physical Activity: Not on file  Stress: Not on file  Social Connections: Not on file  Intimate Partner Violence: Not on file   Review of Systems No change in smell or taste Appetite is off--but able to eat No N/V Sleeping okay     Objective:   Physical Exam Constitutional:      Appearance: Normal appearance.  HENT:     Head:     Comments:  Frontal tenderness    Right Ear: Tympanic membrane and ear canal normal.     Left Ear: Tympanic membrane and ear canal normal.     Mouth/Throat:     Pharynx: No oropharyngeal exudate or posterior oropharyngeal erythema.  Pulmonary:     Effort: Pulmonary effort is normal.     Breath sounds: Normal breath sounds. No wheezing or rales.  Musculoskeletal:     Cervical back: Neck supple.  Lymphadenopathy:     Cervical: No cervical adenopathy.  Neurological:     Mental Status: She is alert.            Assessment & Plan:

## 2024-07-01 NOTE — Assessment & Plan Note (Signed)
 COVID test negative Discussed getting better doses of analgesics (use them separately---ibuprofen or acetaminophen 1000 per dose) If worsened sinus symptoms next week--would try empiric augmentin 

## 2024-07-12 ENCOUNTER — Ambulatory Visit (INDEPENDENT_AMBULATORY_CARE_PROVIDER_SITE_OTHER): Admitting: Physician Assistant

## 2024-07-22 ENCOUNTER — Ambulatory Visit
Admission: RE | Admit: 2024-07-22 | Discharge: 2024-07-22 | Disposition: A | Discharge status: Home / Self Care | Source: Ambulatory Visit | Attending: Obstetrics and Gynecology | Admitting: Obstetrics and Gynecology

## 2024-07-22 DIAGNOSIS — J4520 Mild intermittent asthma, uncomplicated: Secondary | ICD-10-CM | POA: Diagnosis not present

## 2024-07-22 DIAGNOSIS — J3089 Other allergic rhinitis: Secondary | ICD-10-CM | POA: Diagnosis not present

## 2024-07-22 DIAGNOSIS — Z9189 Other specified personal risk factors, not elsewhere classified: Secondary | ICD-10-CM

## 2024-07-22 DIAGNOSIS — Z1239 Encounter for other screening for malignant neoplasm of breast: Secondary | ICD-10-CM | POA: Diagnosis not present

## 2024-07-22 DIAGNOSIS — J40 Bronchitis, not specified as acute or chronic: Secondary | ICD-10-CM | POA: Diagnosis not present

## 2024-07-22 DIAGNOSIS — J301 Allergic rhinitis due to pollen: Secondary | ICD-10-CM | POA: Diagnosis not present

## 2024-07-22 MED ORDER — GADOPICLENOL 0.5 MMOL/ML IV SOLN
9.0000 mL | Freq: Once | INTRAVENOUS | Status: AC | PRN
  Administered 2024-07-22: 9 mL via INTRAVENOUS

## 2024-07-25 NOTE — Progress Notes (Unsigned)
 SUBJECTIVE: Discussed the use of AI scribe software for clinical note transcription with the patient, who gave verbal consent to proceed.  Chief Complaint: Obesity  Interim History: She is up 2 lbs since her last visit.' Down 20 lbs overall TBW loss of ~9 %  Mia Burnett is here to discuss her progress with her obesity treatment plan. She is on the Category 2 Plan and states she is following her eating plan approximately 50-75 % of the time. She states she is walking for exercise 40 minutes 2 times per week. Mia Burnett is a 68 year old female with obesity who presents for a follow-up on her obesity treatment plan.  She has gained two pounds since her last visit and is currently taking topiramate  50 mg daily for weight management. She is also on doxycycline  for a sinus infection, with two days remaining in the course. Her cough has improved, but she continues to experience a sore tongue and stomach pain. Poor sleep is a concern, which she believes is affecting her weight.  She has been adhering to increased water intake to mitigate medication side effects and prevent illness. She spends time outdoors and uses extra sunscreen due to having a pool. Her diet has been bland, mainly consisting of salt, rice, and carbohydrates, due to the antibiotics. She recently celebrated her granddaughter's high school graduation and consumed a small steak over two meals.  She is managing cravings and is focused on her diet, incorporating mushroom omelets and [employer] meals, which she finds convenient. She is concerned about her prediabetes and plans to have her A1c and insulin  levels checked in August during a fasting morning appointment.  Recent blood work showed normal electrolytes, liver, and kidney function.  OBJECTIVE: Visit Diagnoses: Problem List Items Addressed This Visit     Prediabetes - Primary   Relevant Medications   topiramate  (TOPAMAX ) 25 MG tablet   Generalized obesity- Start BMI 39.6    BMI 36.0-36.9,adult Current BMI 36.4   Other Visit Diagnoses       Mixed hyperlipidemia          Obesity She has gained two pounds since her last visit and is currently on topiramate  50 mg daily for weight management. She reports difficulty sleeping, which may be impacting her weight, and is experiencing cravings. Concerns about her diet while on antibiotics were noted. The potential use of metformin for insulin  resistance and prediabetes was discussed, with consideration of gastrointestinal side effects due to recent antibiotic use. - Continue topiramate  50 mg daily - Increase topiramate  to 75 mg next week if tolerated - Monitor for side effects such as grogginess and adjust dose if necessary - Consider metformin for insulin  resistance and prediabetes after gastrointestinal recovery - Encourage protein intake and avoid under-eating - Discuss dietary options such as frittatas and Kevin's meals  Prediabetes with cravings Last A1c was 5.7- not at goal . Insulin  21.7- not at goal of < 5.   Medication(s): Topiramate  50 mg nightly Reports no SE with current topiramate  dose.  Polyphagia:No Lab Results  Component Value Date   HGBA1C 5.7 (H) 11/03/2023   HGBA1C 5.6 07/29/2023   HGBA1C 5.8 02/04/2023   HGBA1C 6.0 07/31/2022   Lab Results  Component Value Date   INSULIN  21.7 11/03/2023   INSULIN  28.1 (H) 04/30/2023    Plan: Continue and increase dose Topiramate  75 mg nightly.  She is still recovering from a severe sinus infection/completing antibiotics at this time, but we did discuss possible addition of  metformin for insulin  resistance/prediabetes management if topiramate  is not effective or she has any side effects with the increased dose. Continue working on nutrition plan to decrease simple carbohydrates, increase lean proteins and exercise to promote weight loss, improve glycemic control and prevent progression to Type 2 diabetes.   Sinus Infection She is on doxycycline  for a  sinus infection and reports improvement in symptoms, though she still has a cough. She experiences side effects from the antibiotics, including a sore tongue and stomach discomfort. Potential thrush as a side effect of antibiotics was discussed. She was treated with diflucan  for yeast vaginitis already and may require further care if tongue soreness does not resolve quickly.  - Complete doxycycline  course - Consider apple cider vinegar swishes for sore tongue - Monitor for signs of thrush and consider additional antifungal treatment if needed  Follow-up She is scheduled for a follow-up appointment to monitor her progress with weight management and prediabetes/insulin  resistance with cravings. . Blood work will be conducted to assess A1c and insulin  levels. The importance of fasting for accurate results was emphasized. - Schedule follow-up appointment on August 13th at 8:30 AM- with fasting labs at that visit.    Vitals Temp: 97.9 F (36.6 C) BP: 129/77 Pulse Rate: 65 SpO2: 99 %   Anthropometric Measurements Height: 5' 3 (1.6 m) Weight: 203 lb (92.1 kg) BMI (Calculated): 35.97 Weight at Last Visit: 201 lb Weight Lost Since Last Visit: 0 Weight Gained Since Last Visit: 2 lb Starting Weight: 223 lb Total Weight Loss (lbs): 20 lb (9.072 kg)   Body Composition  Body Fat %: 47 % Fat Mass (lbs): 95.6 lbs Muscle Mass (lbs): 102.4 lbs Total Body Water (lbs): 80 lbs Visceral Fat Rating : 14   Other Clinical Data Fasting: No Labs: No Today's Visit #: 12 Starting Date: 05/01/23     ASSESSMENT AND PLAN:  Diet: Mia Burnett is currently in the action stage of change. As such, her goal is to continue with weight loss efforts. She has agreed to Category 2 Plan.  Exercise: Mia Burnett has been instructed to work up to a goal of 150 minutes of combined cardio and strengthening exercise per week for weight loss and overall health benefits.   Behavior Modification:  We discussed the following  Behavioral Modification Strategies today: increasing lean protein intake, decreasing simple carbohydrates, increasing vegetables, increase H2O intake, increase high fiber foods, meal planning and cooking strategies, emotional eating strategies , avoiding temptations, and planning for success. We discussed various medication options to help Mia Burnett with her weight loss efforts and we both agreed to increase topiramate  to 75 mg nightly and monitor response to cravings and continue to work on nutritional and behavioral strategies to promote weight loss.  .  Return in about 4 weeks (around 07/12/2024).Aaron Aas She was informed of the importance of frequent follow up visits to maximize her success with intensive lifestyle modifications for her multiple health conditions.  Attestation Statements:   Reviewed by clinician on day of visit: allergies, medications, problem list, medical history, surgical history, family history, social history, and previous encounter notes.   Time spent on visit including pre-visit chart review and post-visit care and charting was 28 minutes.    Emil Klassen, PA-C

## 2024-07-26 ENCOUNTER — Ambulatory Visit (INDEPENDENT_AMBULATORY_CARE_PROVIDER_SITE_OTHER): Admitting: Physician Assistant

## 2024-07-26 ENCOUNTER — Encounter (INDEPENDENT_AMBULATORY_CARE_PROVIDER_SITE_OTHER): Payer: Self-pay | Admitting: Physician Assistant

## 2024-07-26 VITALS — BP 118/73 | HR 66 | Temp 98.2°F | Ht 63.0 in | Wt 199.0 lb

## 2024-07-26 DIAGNOSIS — E782 Mixed hyperlipidemia: Secondary | ICD-10-CM | POA: Diagnosis not present

## 2024-07-26 DIAGNOSIS — Z6835 Body mass index (BMI) 35.0-35.9, adult: Secondary | ICD-10-CM

## 2024-07-26 DIAGNOSIS — J019 Acute sinusitis, unspecified: Secondary | ICD-10-CM

## 2024-07-26 DIAGNOSIS — N951 Menopausal and female climacteric states: Secondary | ICD-10-CM

## 2024-07-26 DIAGNOSIS — R7303 Prediabetes: Secondary | ICD-10-CM

## 2024-07-26 DIAGNOSIS — K219 Gastro-esophageal reflux disease without esophagitis: Secondary | ICD-10-CM | POA: Diagnosis not present

## 2024-07-26 DIAGNOSIS — E669 Obesity, unspecified: Secondary | ICD-10-CM

## 2024-07-26 DIAGNOSIS — E559 Vitamin D deficiency, unspecified: Secondary | ICD-10-CM

## 2024-07-26 MED ORDER — SERTRALINE HCL 50 MG PO TABS: 50.0000 mg | ORAL_TABLET | Freq: Every day | ORAL | 0 refills | Status: DC

## 2024-08-11 ENCOUNTER — Ambulatory Visit (INDEPENDENT_AMBULATORY_CARE_PROVIDER_SITE_OTHER): Admitting: Physician Assistant

## 2024-08-13 ENCOUNTER — Ambulatory Visit (INDEPENDENT_AMBULATORY_CARE_PROVIDER_SITE_OTHER): Admitting: Family

## 2024-08-13 VITALS — BP 124/80 | HR 78 | Temp 98.1°F | Ht 63.0 in | Wt 204.6 lb

## 2024-08-13 DIAGNOSIS — E66812 Obesity, class 2: Secondary | ICD-10-CM | POA: Diagnosis not present

## 2024-08-13 DIAGNOSIS — Z79899 Other long term (current) drug therapy: Secondary | ICD-10-CM | POA: Diagnosis not present

## 2024-08-13 DIAGNOSIS — M19041 Primary osteoarthritis, right hand: Secondary | ICD-10-CM

## 2024-08-13 DIAGNOSIS — R7303 Prediabetes: Secondary | ICD-10-CM

## 2024-08-13 DIAGNOSIS — E6609 Other obesity due to excess calories: Secondary | ICD-10-CM | POA: Diagnosis not present

## 2024-08-13 DIAGNOSIS — E782 Mixed hyperlipidemia: Secondary | ICD-10-CM | POA: Insufficient documentation

## 2024-08-13 DIAGNOSIS — Z23 Encounter for immunization: Secondary | ICD-10-CM

## 2024-08-13 DIAGNOSIS — K219 Gastro-esophageal reflux disease without esophagitis: Secondary | ICD-10-CM

## 2024-08-13 DIAGNOSIS — Z0000 Encounter for general adult medical examination without abnormal findings: Secondary | ICD-10-CM | POA: Diagnosis not present

## 2024-08-13 DIAGNOSIS — M19042 Primary osteoarthritis, left hand: Secondary | ICD-10-CM | POA: Insufficient documentation

## 2024-08-13 LAB — HEMOGLOBIN A1C: Hgb A1c MFr Bld: 5.9 % (ref 4.6–6.5)

## 2024-08-13 LAB — LIPID PANEL
Cholesterol: 174 mg/dL (ref 0–200)
HDL: 63.9 mg/dL (ref >39.00)
LDL Cholesterol: 98 mg/dL (ref 0–99)
NonHDL: 110.52
Total CHOL/HDL Ratio: 3
Triglycerides: 61 mg/dL (ref 0.0–149.0)
VLDL: 12.2 mg/dL (ref 0.0–40.0)

## 2024-08-13 MED ORDER — MELOXICAM 7.5 MG PO TABS: 7.5000 mg | ORAL_TABLET | Freq: Every day | ORAL | 0 refills | Status: DC

## 2024-08-13 MED ORDER — TOPIRAMATE 25 MG PO TABS: 50.0000 mg | ORAL_TABLET | Freq: Every day | ORAL | Status: DC

## 2024-08-13 MED ORDER — OMEPRAZOLE 20 MG PO CPDR
20.0000 mg | DELAYED_RELEASE_CAPSULE | Freq: Every day | ORAL | 3 refills | Status: AC
Start: 2024-08-13 — End: ?

## 2024-08-13 NOTE — Progress Notes (Addendum)
 "  Subjective:  Patient ID: Mia Burnett, female    DOB: 08-22-1956  Age: 68 y.o. MRN: [id number]  Patient Care Team: Corwin Antu, FNP as PCP - General (Family Medicine)   CC:  Chief Complaint  Patient presents with   Annual Exam    HPI Mia Burnett is a 68 y.o. female who presents today for an annual physical exam. She reports consuming a general diet. Walking several times a week and doing water aerobics She generally feels well. She reports sleeping well only when taking her ambien. She does not have additional problems to discuss today.   Vision:Within last year Dental:Receives regular dental care  Mammogram: 07/22/24 (MRI due to high family hx) mammogram due 8/25 has it scheduled Last pap: >65 y/o   Colonoscopy: 07/24/22 Bone density scan: 2023  Pt is without acute concerns.   Advanced Directives Patient does not have advanced directives     DEPRESSION SCREENING    08/13/2024    8:26 AM 05/14/2024    4:12 PM 05/05/2024    9:48 AM 07/29/2023    8:19 AM 01/28/2023    8:48 AM  PHQ 2/9 Scores  PHQ - 2 Score 0 0 0 1 0  PHQ- 9 Score 0 2  3 0     ROS: Negative unless specifically indicated above in HPI.    Current Outpatient Medications:    albuterol (VENTOLIN HFA) 108 (90 Base) MCG/ACT inhaler, Inhale into the lungs., Disp: , Rfl:    Calcium  Citrate (CITRACAL PO), Take 800 mg by mouth., Disp: , Rfl:    Cholecalciferol (VITAMIN D3) 50 MCG (2000 UT) TABS, Take by mouth., Disp: , Rfl:    Coenzyme Q10 (COQ10) 200 MG CAPS, Take by mouth., Disp: , Rfl:    EPINEPHrine 0.3 mg/0.3 mL IJ SOAJ injection, AUTO-INJECTOR INTO OUTER THIGH INJECTION AS NEEDED FOR ANAPHYLAXIS., Disp: , Rfl:    fluticasone (FLONASE ALLERGY RELIEF) 50 MCG/ACT nasal spray, 2 sprays, Disp: , Rfl:    meloxicam  (MOBIC ) 7.5 MG tablet, Take 1 tablet (7.5 mg total) by mouth daily., Disp: 90 tablet, Rfl: 0   montelukast (SINGULAIR) 10 MG tablet, , Disp: , Rfl:    Multiple Vitamin (MULTIVITAMIN) capsule,  Take 1 capsule by mouth daily., Disp: , Rfl:    PRESCRIPTION MEDICATION, Allergy injections, Disp: , Rfl:    rosuvastatin  (CRESTOR ) 10 MG tablet, TAKE 1 TABLET AT BEDTIME, Disp: 90 tablet, Rfl: 3   sertraline  (ZOLOFT ) 50 MG tablet, Take 1 tablet (50 mg total) by mouth daily., Disp: 90 tablet, Rfl: 0   zolpidem (AMBIEN) 10 MG tablet, , Disp: , Rfl:    omeprazole  (PRILOSEC) 20 MG capsule, Take 1 capsule (20 mg total) by mouth daily., Disp: 90 capsule, Rfl: 3   topiramate  (TOPAMAX ) 25 MG tablet, Take 2 tablets (50 mg total) by mouth daily., Disp: , Rfl:     Objective:    BP 124/80 (BP Location: Left Arm, Patient Position: Sitting, Cuff Size: Large)   Pulse 78   Temp 98.1 F (36.7 C) (Temporal)   Ht 5' 3 (1.6 m)   Wt 204 lb 9.6 oz (92.8 kg)   SpO2 100%   BMI 36.24 kg/m   BP Readings from Last 3 Encounters:  08/13/24 124/80  07/26/24 118/73  07/01/24 114/70      Physical Exam  Physical Exam Constitutional:      General: She is not in acute distress.    Appearance: Normal appearance. She is obese. She is not  ill-appearing.  HENT:     Head: Normocephalic.     Right Ear: Tympanic membrane normal.     Left Ear: Tympanic membrane normal.     Nose: Nose normal.     Mouth/Throat:     Mouth: Mucous membranes are moist.  Eyes:     Extraocular Movements: Extraocular movements intact.     Pupils: Pupils are equal, round, and reactive to light.  Cardiovascular:     Rate and Rhythm: Normal rate and regular rhythm.  Pulmonary:     Effort: Pulmonary effort is normal.     Breath sounds: Normal breath sounds.  Abdominal:     General: Abdomen is flat. Bowel sounds are normal.     Palpations: Abdomen is soft.     Tenderness: There is no guarding or rebound.  Musculoskeletal:        General: Normal range of motion.     Cervical back: Normal range of motion.  Skin:    General: Skin is warm.     Capillary Refill: Capillary refill takes less than 2 seconds.  Neurological:      General: No focal deficit present.     Mental Status: She is alert.  Psychiatric:        Mood and Affect: Mood normal.        Behavior: Behavior normal.        Thought Content: Thought content normal.        Judgment: Judgment normal.      Results       Assessment & Plan:   Assessment and Plan Assessment & Plan Adult Wellness Visit Reports improvement after a challenging spring and summer with multiple medical visits. Currently on Topamax  50 mg for weight loss, experiencing difficulty with higher doses. Transitioned from Paxil to Zoloft  with good tolerance. Considering breast arterial calcification test for cardiovascular risk assessment. Thyroid  and vitamin D  levels are well-managed. Liver function was checked in June, no immediate need for repeat testing. - Consider breast arterial calcification test if cost is reasonable (around $100) - Continue current medications as prescribed - Monitor liver function every six months due to rosuvastatin  use - Discuss potential weaning off Prilosec if heartburn is not present Patient Counseling(The following topics were reviewed):  Preventative care handout given to pt  Health maintenance and immunizations reviewed. Please refer to Health maintenance section. Pt advised on safe sex, wearing seatbelts in car, and proper nutrition labwork ordered today for annual Dental health: Discussed importance of regular tooth brushing, flossing, and dental visits.   Class 2 obesity due to excess calories On Topamax  50 mg for weight loss, experiencing increased sleepiness and tiredness at higher doses. Effectiveness for weight loss is unclear to her. - Continue Topamax  50 mg for weight loss as tolerated  Mixed hyperlipidemia Managed with rosuvastatin  10 mg. Discussed breast arterial calcification test for cardiovascular risk assessment due to high cholesterol. - Continue rosuvastatin  10 mg at bedtime - Consider breast arterial calcification test if  cost is reasonable (around $100)  Primary osteoarthritis, right hand Experiences wrist flare-ups managed with meloxicam . Tolerates meloxicam  well but advised against concurrent use with ibuprofen or Advil due to potential stomach irritation, especially with sertraline  use. - Prescribe meloxicam  for 3-5 days as needed for arthritis flare-ups - Advise to take meloxicam  with food and avoid concurrent use with ibuprofen or Advil  Gastroesophageal reflux disease (GERD) On omeprazole  20 mg daily. Discussed long-term use risks, including bone health and mineral absorption issues. Temporarily doubled dose due to  throat irritation from antibiotics, which resolved symptoms. Suggested weaning off if heartburn is absent, with potential rebound effect. - Consider weaning off Prilosec by taking it every other day for a week or two, then stopping - Monitor for rebound heartburn and adjust as needed - Avoid foods that may exacerbate heartburn, such as tomato-based sauces, caffeine, chocolate, fried fatty foods, and spicy foods  Recording duration: 14 minutes        Follow-up: Return in about 6 months (around 02/13/2025) for f/u cholesterol.   Ginger Patrick, FNP  "

## 2024-08-18 DIAGNOSIS — Z1231 Encounter for screening mammogram for malignant neoplasm of breast: Secondary | ICD-10-CM | POA: Diagnosis not present

## 2024-08-19 ENCOUNTER — Ambulatory Visit: Payer: Self-pay | Admitting: Family

## 2024-08-31 NOTE — Progress Notes (Unsigned)
 SUBJECTIVE: Discussed the use of AI scribe software for clinical note transcription with the patient, who gave verbal consent to proceed.  Chief Complaint: Obesity  Interim History: She is up 1 lb since last visit Down 23 lbs overall TBW loss of 10.3%  Mia Burnett is here to discuss her progress with her obesity treatment plan. She is on the Category 2 Plan and states she is following her eating plan approximately 75 % of the time. She states she is exercising walking/water aerobics 45 minutes 3 times per week.  Mia Burnett is a 68 year old female who presents for follow-up on her obesity treatment plan.  She has been following a category two obesity treatment plan approximately 75% of the time, incorporating water aerobics and walking three times per week. Despite feeling complacent with her diet, especially after her daughter's visit, she remains committed to her weight loss journey. She has lost several clothing sizes and is pleased with her progress but aims to continue losing weight. Her A1c levels have been increasing slightly, prompting her desire to regain control.  Her medical history includes prediabetes, mixed hyperlipidemia, vitamin D  deficiency, and gastroesophageal reflux disease. She is currently fasting for laboratory tests. Her diet consists of more whole foods, though she acknowledges not meeting the recommended protein intake or hydration levels. She is not skipping meals and maintains a sleep schedule of seven to nine hours per night. She takes a multivitamin and received a pneumonia vaccine at her last primary care visit. She plans to get a flu shot soon.  Her energy levels have significantly improved since recovering from strep throat in May, after testing negative for COVID and flu.  She takes 50 mg of topiramate  daily for cravings, as a higher dose caused cognitive fog.  She works four days a week from 8 to 5 and walks her dog in the evenings. She has lost 23 pounds and  reports being pleased with her progress, but aims to continue losing weight.  She saw her PCP in August for AWV and had some labs done at that time and these were reviewed today. Her A1c is slightly up from previous. Lipids are at goal with patient on crestor  10 mg daily. She does take CoQ10.  Fasting insulin  level today and vitamin D  level today.  The patient was informed we would discuss the lab results at the next visit unless there is a critical issue that needs to be addressed sooner. The patient agreed to keep the next visit at the agreed upon time to discuss these results.   OBJECTIVE: Visit Diagnoses: Problem List Items Addressed This Visit     GERD without esophagitis   Prediabetes - Primary   Relevant Medications   metFORMIN  (GLUCOPHAGE ) 500 MG tablet   topiramate  (TOPAMAX ) 25 MG tablet   Other Relevant Orders   Insulin , random   Vitamin D  deficiency   Relevant Orders   VITAMIN D  25 Hydroxy (Vit-D Deficiency, Fractures)   Generalized obesity- Start BMI 39.6   Relevant Medications   metFORMIN  (GLUCOPHAGE ) 500 MG tablet   Mixed hyperlipidemia   Other Visit Diagnoses       Other fatigue         BMI 35.0-35.9,adult Current BMI 35.6         Obesity Obesity management is ongoing with a significant weight loss of 10.3% of body weight. However, she reports complacency and is concerned about her increase in A1c. - Encourage journaling of food intake and exercise -  Advise calorie intake of 1100-1200 calories per day - Advise protein intake of at least 85 grams per day - Consider using a weighted vest for walking - Encourage walking 20-30 minutes after meals to help control blood sugar  Prediabetes with cravings. Prediabetes is being monitored with a slightly elevated A1c. The addition of metformin  is discussed to improve insulin  sensitivity and aid in weight management. A1c slightly up and insulin  was significantly elevated previously.  Currently on topiramate  50 mg nightly  for cravings and feels helpful. Tried higher dose and felt fatigued on 75 mg nightly.   Discussed starting metformin  for insulin  resistance/prediabetes.  Discussed possible risks, benefits and side effects and the patient is agreeable to try at this time.  Lab Results  Component Value Date   HGBA1C 5.9 08/13/2024   HGBA1C 5.7 (H) 11/03/2023   HGBA1C 5.6 07/29/2023   Lab Results  Component Value Date   LDLCALC 98 08/13/2024   CREATININE 0.88 06/05/2024   INSULIN   Date Value Ref Range Status  11/03/2023 21.7 2.6 - 24.9 uIU/mL Final  ]Continue working on nutrition plan to decrease simple carbohydrates, increase lean proteins and exercise to promote weight loss, improve glycemic control and prevent progression to Type 2 diabetes.  - Continue/refill topiramate  50 mg nightly x 90 days.  - Start metformin  at 250 mg daily, increase to 500 mg if tolerated x 90 days.  - Monitor A1c/insulin  level/CMET in 4-6 months - Check insulin  level today Meds ordered this encounter  Medications   metFORMIN  (GLUCOPHAGE ) 500 MG tablet    Sig: Take 1 tablet (500 mg total) by mouth daily. Start with 1/2 tablet daily and increase if tolerated after 1 week.    Dispense:  30 tablet    Refill:  2   topiramate  (TOPAMAX ) 25 MG tablet    Sig: Take 2 tablets (50 mg total) by mouth daily.    Dispense:  60 tablet    Refill:  2    Hyperlipidemia LDL is at goal. Medication(s): Crestor  10 mg daily Cardiovascular risk factors: advanced age (older than 64 for men, 57 for women), dyslipidemia, and obesity (BMI >= 30 kg/m2)  Lab Results  Component Value Date   CHOL 174 08/13/2024   HDL 63.90 08/13/2024   LDLCALC 98 08/13/2024   TRIG 61.0 08/13/2024   CHOLHDL 3 08/13/2024   CHOLHDL 3 02/04/2023   CHOLHDL 3 07/31/2022   Lab Results  Component Value Date   ALT 25 06/05/2024   AST 34 06/05/2024   ALKPHOS 54 06/05/2024   BILITOT 1.1 06/05/2024   The 10-year ASCVD risk score (Arnett DK, et al., 2019) is:  4.4%   Values used to calculate the score:     Age: 39 years     Clincally relevant sex: Female     Is Non-Hispanic African American: No     Diabetic: No     Tobacco smoker: No     Systolic Blood Pressure: 114 mmHg     Is BP treated: No     HDL Cholesterol: 63.9 mg/dL     Total Cholesterol: 174 mg/dL  Plan: Continue Crestor  10 mg daily Continue to work on nutrition plan -decreasing simple carbohydrates, increasing lean proteins, decreasing saturated fats and cholesterol , avoiding trans fats and exercise as able to promote weight loss, improve lipids and decrease cardiovascular risks.   Vitamin D  deficiency Vitamin D  deficiency is being monitored. She is taking 2000 units of vitamin D  daily. No N/V or muscle weakness or  other side effects with OTC vitamin D .  Last vitamin D  Lab Results  Component Value Date   VD25OH 57.0 11/03/2023   Previous level at goal.  Low vitamin D  levels can be associated with adiposity and may result in leptin resistance and weight gain. Also associated with fatigue.  Currently on vitamin D  supplementation without any adverse effects such as nausea, vomiting or muscle weakness.  Continue vitamin D  2000 units daily - Check vitamin D  level today and adjust supplementation if indicated.   Vitals Temp: 97.9 F (36.6 C) BP: 114/71 Pulse Rate: (!) 58 SpO2: 96 %   Anthropometric Measurements Height: 5' 3 (1.6 m) Weight: 200 lb (90.7 kg) BMI (Calculated): 35.44 Weight at Last Visit: 199lb Weight Lost Since Last Visit: 0lb Weight Gained Since Last Visit: 1lb Starting Weight: 223lb Total Weight Loss (lbs): 23 lb (10.4 kg)   Body Composition  Body Fat %: 46.3 % Fat Mass (lbs): 93 lbs Muscle Mass (lbs): 102.4 lbs Total Body Water (lbs): 75.2 lbs Visceral Fat Rating : 14   Other Clinical Data Fasting: yes Labs: yes Today's Visit #: 14 Starting Date: 05/01/23     ASSESSMENT AND PLAN:  Diet: Xitlali is currently in the action stage of  change. As such, her goal is to continue with weight loss efforts. She has agreed to keeping a food journal and adhering to recommended goals of 1100-1200 calories and 85+ grams of protein.  Exercise: Trevia has been instructed to work up to a goal of 150 minutes of combined cardio and strengthening exercise per week for weight loss and overall health benefits.   Behavior Modification:  We discussed the following Behavioral Modification Strategies today: increasing lean protein intake, decreasing simple carbohydrates, increasing vegetables, increase H2O intake, increase high fiber foods, no skipping meals, meal planning and cooking strategies, emotional eating strategies , avoiding temptations, planning for success, and keep a strict food journal. We discussed various medication options to help Jaylean with her weight loss efforts and we both agreed to change to journaling plan to increase accountability.  Return in about 4 weeks (around 09/29/2024).SABRA She was informed of the importance of frequent follow up visits to maximize her success with intensive lifestyle modifications for her multiple health conditions.  Attestation Statements:   Reviewed by clinician on day of visit: allergies, medications, problem list, medical history, surgical history, family history, social history, and previous encounter notes.   Time spent on visit including pre-visit chart review and post-visit care and charting was 41 minutes.    Mia Wilinski, PA-C

## 2024-09-01 ENCOUNTER — Encounter (INDEPENDENT_AMBULATORY_CARE_PROVIDER_SITE_OTHER): Payer: Self-pay | Admitting: Physician Assistant

## 2024-09-01 ENCOUNTER — Ambulatory Visit (INDEPENDENT_AMBULATORY_CARE_PROVIDER_SITE_OTHER): Admitting: Physician Assistant

## 2024-09-01 VITALS — BP 114/71 | HR 58 | Temp 97.9°F | Ht 63.0 in | Wt 200.0 lb

## 2024-09-01 DIAGNOSIS — K219 Gastro-esophageal reflux disease without esophagitis: Secondary | ICD-10-CM | POA: Diagnosis not present

## 2024-09-01 DIAGNOSIS — E559 Vitamin D deficiency, unspecified: Secondary | ICD-10-CM | POA: Diagnosis not present

## 2024-09-01 DIAGNOSIS — E782 Mixed hyperlipidemia: Secondary | ICD-10-CM

## 2024-09-01 DIAGNOSIS — Z6835 Body mass index (BMI) 35.0-35.9, adult: Secondary | ICD-10-CM

## 2024-09-01 DIAGNOSIS — E669 Obesity, unspecified: Secondary | ICD-10-CM

## 2024-09-01 DIAGNOSIS — R7303 Prediabetes: Secondary | ICD-10-CM | POA: Diagnosis not present

## 2024-09-01 DIAGNOSIS — R5383 Other fatigue: Secondary | ICD-10-CM

## 2024-09-01 MED ORDER — METFORMIN HCL 500 MG PO TABS: 500.0000 mg | ORAL_TABLET | Freq: Every day | ORAL | 2 refills | Status: AC

## 2024-09-01 MED ORDER — TOPIRAMATE 25 MG PO TABS: 50.0000 mg | ORAL_TABLET | Freq: Every day | ORAL | 2 refills | Status: DC

## 2024-09-03 LAB — VITAMIN D 25 HYDROXY (VIT D DEFICIENCY, FRACTURES): Vit D, 25-Hydroxy: 60.3 ng/mL (ref 30.0–100.0)

## 2024-09-03 LAB — INSULIN, RANDOM: INSULIN: 22.1 u[IU]/mL (ref 2.6–24.9)

## 2024-09-27 NOTE — Progress Notes (Unsigned)
 SUBJECTIVE: Discussed the use of AI scribe software for clinical note transcription with the patient, who gave verbal consent to proceed.  Chief Complaint: Obesity  Interim History: She has maintained her weight since last visit. Down 23 lbs overall TBW loss of 10.3%  Ultimate goal weight of 185 lbs.   Mia Burnett is here to discuss her progress with her obesity treatment plan. She is on the Category 2 Plan and states she is following her eating plan approximately 85 % of the time. She states she is exercising walking 30-45 minutes 4 times per week. Mia Burnett is a 68 year old female who presents for follow-up of her obesity treatment plan.  She has a history of prediabetes, mixed hyperlipidemia, and vitamin D  deficiency. She has maintained her weight since the last visit and adheres to her dietary plan approximately 85% of the time. She is not consistently consuming the recommended amount of protein but is eating more whole foods and drinking sufficient water. She is not skipping meals and sleeps 7 to 9 hours per night. She engages in physical activity by walking 30 to 45 minutes four days per week.  She is currently taking metformin  250 mg and has been experiencing extreme fatigue and weakness in her legs since starting the medication. Despite these side effects, she has not discontinued the medication. She has increased her water intake and has eliminated sugary coffee drinks from her diet.  Her vitamin D  level has improved from 57 to 60.3, and she is taking 2000 units of vitamin D  daily. She has also added vitamin C to her regimen for the winter. She is uncertain if she is taking a B complex vitamin and plans to verify this at home.  She is taking Topamax  at night and experiences fatigue, which may be related to topamax  vs metformin  which she recently started.   Stress level is manageable, although work is busy, and she sometimes works late. She engages in crafting as a form of therapy  and is getting ready for Christmas craft shows.  OBJECTIVE: Visit Diagnoses: Problem List Items Addressed This Visit     Prediabetes - Primary   Vitamin D  deficiency   Generalized obesity- Start BMI 39.6   Mixed hyperlipidemia   Other Visit Diagnoses       BMI 35.0-35.9,adult Current BMI 35.4         Prediabetes with cravings.  Prediabetes with elevated insulin  levels and adverse effects from metformin , including fatigue and leg weakness. Potential B12 deficiency due to metformin . Was on metformin  250 mg daily. Topiramate  50 mg nightly. Reports fatigue and leg weakness as noted. ? Side effect of topiramate  vs metformin . Was on topiramate  without SE for many months, so ? More related to metformin .  Plan: Continue working on nutrition plan to decrease simple carbohydrates, increase lean proteins and exercise to promote weight loss, improve glycemic control and prevent progression to Type 2 diabetes.   - Discontinue metformin  for 1-2 weeks to assess symptom improvement. - Consider B complex vitamin supplementation for potential B12 deficiency. - decrease topiramate  to 25 mg nightly and monitor fatigue levels - Reassess need for metformin  based on future A1c and insulin  levels.  Obesity Obesity management with lifestyle modifications. Weight maintenance observed, with increased water intake and reduction of high-calorie coffee drinks. Engaging in regular physical activity. Current weight is the lowest in years, with a total weight loss of 23 pounds. - Continue lifestyle modifications, including increased water intake and regular physical activity. -  Avoid high-calorie coffee drinks and other sources of simple sugars. - Monitor weight and dietary habits.  Hyperlipidemia LDL is at goal. HDL at goal. Trig at goal.  Medication(s): Crestor  10 mg daily. No SE.  Cardiovascular risk factors: advanced age (older than 62 for men, 77 for women), dyslipidemia, obesity (BMI >= 30 kg/m2), and  sedentary lifestyle  Lab Results  Component Value Date   CHOL 174 08/13/2024   HDL 63.90 08/13/2024   LDLCALC 98 08/13/2024   TRIG 61.0 08/13/2024   CHOLHDL 3 08/13/2024   CHOLHDL 3 02/04/2023   CHOLHDL 3 07/31/2022   Lab Results  Component Value Date   ALT 25 06/05/2024   AST 34 06/05/2024   ALKPHOS 54 06/05/2024   BILITOT 1.1 06/05/2024   The 10-year ASCVD risk score (Arnett DK, et al., 2019) is: 4.7%   Values used to calculate the score:     Age: 21 years     Clincally relevant sex: Female     Is Non-Hispanic African American: No     Diabetic: No     Tobacco smoker: No     Systolic Blood Pressure: 111 mmHg     Is BP treated: No     HDL Cholesterol: 63.9 mg/dL     Total Cholesterol: 174 mg/dL  Plan: Continue Crestor  10 mg daily.  Continue to work on nutrition plan -decreasing simple carbohydrates, increasing lean proteins, decreasing saturated fats and cholesterol , avoiding trans fats and exercise as able to promote weight loss, improve lipids and decrease cardiovascular risks. Monitor lipids periodically.      Vitamin D  deficiency Vitamin D  levels improved and within target range (60.3 ng/mL). No N/V or muscle weakness with vitamin D . At goal range.  - Continue vitamin D  supplementation of 2000 IU daily.  Vitals Temp: 98.3 F (36.8 C) BP: 111/73 Pulse Rate: 71 SpO2: 98 %   Anthropometric Measurements Height: 5' 3 (1.6 m) Weight: 200 lb (90.7 kg) BMI (Calculated): 35.44 Weight at Last Visit: 200 lb Weight Lost Since Last Visit: 0 Weight Gained Since Last Visit: 0 Starting Weight: 223 lb Total Weight Loss (lbs): 23 lb (10.4 kg)   Body Composition  Body Fat %: 47 % Fat Mass (lbs): 94 lbs Muscle Mass (lbs): 100.6 lbs Total Body Water (lbs): 77.6 lbs Visceral Fat Rating : 14   Other Clinical Data Fasting: No Labs: No Today's Visit #: 15 Starting Date: 05/01/23     ASSESSMENT AND PLAN:  Diet: Mia Burnett is currently in the action stage of  change. As such, her goal is to continue with weight loss efforts. She has agreed to Category 2 Plan.  Exercise: Mia Burnett has been instructed to work up to a goal of 150 minutes of combined cardio and strengthening exercise per week for weight loss and overall health benefits.   Behavior Modification:  We discussed the following Behavioral Modification Strategies today: increasing lean protein intake, decreasing simple carbohydrates, increasing vegetables, increase H2O intake, increase high fiber foods, meal planning and cooking strategies, avoiding temptations, and planning for success. We discussed various medication options to help Mia Burnett with her weight loss efforts and we both agreed to continue current treatment but discontinue metformin  for now and decrease topiramate  to 25 mg nightly and monitor closely for continued fatigue or feeling weak.  Return in about 4 weeks (around 10/26/2024).Mia Burnett She was informed of the importance of frequent follow up visits to maximize her success with intensive lifestyle modifications for her multiple health conditions.  Attestation Statements:  Reviewed by clinician on day of visit: allergies, medications, problem list, medical history, surgical history, family history, social history, and previous encounter notes.   Time spent on visit including pre-visit chart review and post-visit care and charting was 36 minutes.    Jantz Main, PA-C

## 2024-09-28 ENCOUNTER — Ambulatory Visit (INDEPENDENT_AMBULATORY_CARE_PROVIDER_SITE_OTHER): Admitting: Physician Assistant

## 2024-09-28 ENCOUNTER — Encounter (INDEPENDENT_AMBULATORY_CARE_PROVIDER_SITE_OTHER): Payer: Self-pay | Admitting: Physician Assistant

## 2024-09-28 VITALS — BP 111/73 | HR 71 | Temp 98.3°F | Ht 63.0 in | Wt 200.0 lb

## 2024-09-28 DIAGNOSIS — E782 Mixed hyperlipidemia: Secondary | ICD-10-CM | POA: Diagnosis not present

## 2024-09-28 DIAGNOSIS — Z6835 Body mass index (BMI) 35.0-35.9, adult: Secondary | ICD-10-CM | POA: Diagnosis not present

## 2024-09-28 DIAGNOSIS — R7303 Prediabetes: Secondary | ICD-10-CM | POA: Diagnosis not present

## 2024-09-28 DIAGNOSIS — E669 Obesity, unspecified: Secondary | ICD-10-CM

## 2024-09-28 DIAGNOSIS — E559 Vitamin D deficiency, unspecified: Secondary | ICD-10-CM

## 2024-10-26 ENCOUNTER — Ambulatory Visit (INDEPENDENT_AMBULATORY_CARE_PROVIDER_SITE_OTHER): Admitting: Physician Assistant

## 2024-11-11 ENCOUNTER — Other Ambulatory Visit: Payer: Self-pay | Admitting: Family

## 2024-11-11 DIAGNOSIS — M19041 Primary osteoarthritis, right hand: Secondary | ICD-10-CM

## 2024-11-14 NOTE — Progress Notes (Unsigned)
 SUBJECTIVE: Discussed the use of AI scribe software for clinical note transcription with the patient, who gave verbal consent to proceed.  Chief Complaint: Obesity  Interim History: She has maintained her weight since last visit. Down 23 lbs overall TBW loss of 10.3%  Ultimate goal weight of 185 lbs.   Mia Burnett is here to discuss her progress with her obesity treatment plan. She is on the Category 2 Plan and states she is following her eating plan approximately 85 % of the time. She states she is exercising walking 30-45 minutes 4 times per week. Mia Burnett is a 68 year old female who presents for follow-up of her obesity treatment plan.  She has a history of prediabetes, mixed hyperlipidemia, and vitamin D  deficiency. She has maintained her weight since the last visit and adheres to her dietary plan approximately 85% of the time. She is not consistently consuming the recommended amount of protein but is eating more whole foods and drinking sufficient water. She is not skipping meals and sleeps 7 to 9 hours per night. She engages in physical activity by walking 30 to 45 minutes four days per week.  She is currently taking metformin  250 mg and has been experiencing extreme fatigue and weakness in her legs since starting the medication. Despite these side effects, she has not discontinued the medication. She has increased her water intake and has eliminated sugary coffee drinks from her diet.  Her vitamin D  level has improved from 57 to 60.3, and she is taking 2000 units of vitamin D  daily. She has also added vitamin C to her regimen for the winter. She is uncertain if she is taking a B complex vitamin and plans to verify this at home.  She is taking Topamax  at night and experiences fatigue, which may be related to topamax  vs metformin  which she recently started.   Stress level is manageable, although work is busy, and she sometimes works late. She engages in crafting as a form of therapy  and is getting ready for Christmas craft shows.  OBJECTIVE: Visit Diagnoses: Problem List Items Addressed This Visit     Prediabetes - Primary   Vitamin D  deficiency   Generalized obesity- Start BMI 39.6   Mixed hyperlipidemia   Other Visit Diagnoses       BMI 35.0-35.9,adult Current BMI 35.4         Prediabetes with cravings.  Prediabetes with elevated insulin  levels and adverse effects from metformin , including fatigue and leg weakness. Potential B12 deficiency due to metformin . Was on metformin  250 mg daily. Topiramate  50 mg nightly. Reports fatigue and leg weakness as noted. ? Side effect of topiramate  vs metformin . Was on topiramate  without SE for many months, so ? More related to metformin .  Plan: Continue working on nutrition plan to decrease simple carbohydrates, increase lean proteins and exercise to promote weight loss, improve glycemic control and prevent progression to Type 2 diabetes.   - Discontinue metformin  for 1-2 weeks to assess symptom improvement. - Consider B complex vitamin supplementation for potential B12 deficiency. - decrease topiramate  to 25 mg nightly and monitor fatigue levels - Reassess need for metformin  based on future A1c and insulin  levels.  Obesity Obesity management with lifestyle modifications. Weight maintenance observed, with increased water intake and reduction of high-calorie coffee drinks. Engaging in regular physical activity. Current weight is the lowest in years, with a total weight loss of 23 pounds. - Continue lifestyle modifications, including increased water intake and regular physical activity. -  Avoid high-calorie coffee drinks and other sources of simple sugars. - Monitor weight and dietary habits.  Hyperlipidemia LDL is at goal. HDL at goal. Trig at goal.  Medication(s): Crestor  10 mg daily. No SE.  Cardiovascular risk factors: advanced age (older than 62 for men, 77 for women), dyslipidemia, obesity (BMI >= 30 kg/m2), and  sedentary lifestyle  Lab Results  Component Value Date   CHOL 174 08/13/2024   HDL 63.90 08/13/2024   LDLCALC 98 08/13/2024   TRIG 61.0 08/13/2024   CHOLHDL 3 08/13/2024   CHOLHDL 3 02/04/2023   CHOLHDL 3 07/31/2022   Lab Results  Component Value Date   ALT 25 06/05/2024   AST 34 06/05/2024   ALKPHOS 54 06/05/2024   BILITOT 1.1 06/05/2024   The 10-year ASCVD risk score (Arnett DK, et al., 2019) is: 4.7%   Values used to calculate the score:     Age: 21 years     Clincally relevant sex: Female     Is Non-Hispanic African American: No     Diabetic: No     Tobacco smoker: No     Systolic Blood Pressure: 111 mmHg     Is BP treated: No     HDL Cholesterol: 63.9 mg/dL     Total Cholesterol: 174 mg/dL  Plan: Continue Crestor  10 mg daily.  Continue to work on nutrition plan -decreasing simple carbohydrates, increasing lean proteins, decreasing saturated fats and cholesterol , avoiding trans fats and exercise as able to promote weight loss, improve lipids and decrease cardiovascular risks. Monitor lipids periodically.      Vitamin D  deficiency Vitamin D  levels improved and within target range (60.3 ng/mL). No N/V or muscle weakness with vitamin D . At goal range.  - Continue vitamin D  supplementation of 2000 IU daily.  Vitals Temp: 98.3 F (36.8 C) BP: 111/73 Pulse Rate: 71 SpO2: 98 %   Anthropometric Measurements Height: 5' 3 (1.6 m) Weight: 200 lb (90.7 kg) BMI (Calculated): 35.44 Weight at Last Visit: 200 lb Weight Lost Since Last Visit: 0 Weight Gained Since Last Visit: 0 Starting Weight: 223 lb Total Weight Loss (lbs): 23 lb (10.4 kg)   Body Composition  Body Fat %: 47 % Fat Mass (lbs): 94 lbs Muscle Mass (lbs): 100.6 lbs Total Body Water (lbs): 77.6 lbs Visceral Fat Rating : 14   Other Clinical Data Fasting: No Labs: No Today's Visit #: 15 Starting Date: 05/01/23     ASSESSMENT AND PLAN:  Diet: Jermesha is currently in the action stage of  change. As such, her goal is to continue with weight loss efforts. She has agreed to Category 2 Plan.  Exercise: Assunta has been instructed to work up to a goal of 150 minutes of combined cardio and strengthening exercise per week for weight loss and overall health benefits.   Behavior Modification:  We discussed the following Behavioral Modification Strategies today: increasing lean protein intake, decreasing simple carbohydrates, increasing vegetables, increase H2O intake, increase high fiber foods, meal planning and cooking strategies, avoiding temptations, and planning for success. We discussed various medication options to help Taneil with her weight loss efforts and we both agreed to continue current treatment but discontinue metformin  for now and decrease topiramate  to 25 mg nightly and monitor closely for continued fatigue or feeling weak.  Return in about 4 weeks (around 10/26/2024).SABRA She was informed of the importance of frequent follow up visits to maximize her success with intensive lifestyle modifications for her multiple health conditions.  Attestation Statements:  Reviewed by clinician on day of visit: allergies, medications, problem list, medical history, surgical history, family history, social history, and previous encounter notes.   Time spent on visit including pre-visit chart review and post-visit care and charting was 36 minutes.    Jantz Main, PA-C

## 2024-11-15 ENCOUNTER — Encounter (INDEPENDENT_AMBULATORY_CARE_PROVIDER_SITE_OTHER): Payer: Self-pay | Admitting: Physician Assistant

## 2024-11-15 ENCOUNTER — Ambulatory Visit (INDEPENDENT_AMBULATORY_CARE_PROVIDER_SITE_OTHER): Admitting: Physician Assistant

## 2024-11-15 VITALS — BP 114/79 | HR 61 | Temp 97.9°F | Ht 63.0 in | Wt 198.0 lb

## 2024-11-15 DIAGNOSIS — K219 Gastro-esophageal reflux disease without esophagitis: Secondary | ICD-10-CM | POA: Diagnosis not present

## 2024-11-15 DIAGNOSIS — R7303 Prediabetes: Secondary | ICD-10-CM

## 2024-11-15 DIAGNOSIS — G4700 Insomnia, unspecified: Secondary | ICD-10-CM | POA: Diagnosis not present

## 2024-11-15 DIAGNOSIS — F5101 Primary insomnia: Secondary | ICD-10-CM

## 2024-11-15 DIAGNOSIS — E782 Mixed hyperlipidemia: Secondary | ICD-10-CM | POA: Diagnosis not present

## 2024-11-15 DIAGNOSIS — E669 Obesity, unspecified: Secondary | ICD-10-CM

## 2024-11-15 DIAGNOSIS — E559 Vitamin D deficiency, unspecified: Secondary | ICD-10-CM

## 2024-11-15 DIAGNOSIS — Z6835 Body mass index (BMI) 35.0-35.9, adult: Secondary | ICD-10-CM | POA: Diagnosis not present

## 2024-11-15 MED ORDER — TOPIRAMATE 25 MG PO TABS: 50.0000 mg | ORAL_TABLET | Freq: Every day | ORAL | 2 refills | Status: AC

## 2024-11-24 ENCOUNTER — Telehealth (INDEPENDENT_AMBULATORY_CARE_PROVIDER_SITE_OTHER): Payer: Self-pay | Admitting: Nurse Practitioner

## 2024-11-24 ENCOUNTER — Other Ambulatory Visit (INDEPENDENT_AMBULATORY_CARE_PROVIDER_SITE_OTHER): Payer: Self-pay | Admitting: Physician Assistant

## 2024-11-24 ENCOUNTER — Other Ambulatory Visit (INDEPENDENT_AMBULATORY_CARE_PROVIDER_SITE_OTHER): Payer: Self-pay

## 2024-11-24 DIAGNOSIS — N951 Menopausal and female climacteric states: Secondary | ICD-10-CM

## 2024-11-24 MED ORDER — SERTRALINE HCL 50 MG PO TABS: 50.0000 mg | ORAL_TABLET | Freq: Every day | ORAL | 0 refills | Status: AC

## 2024-11-24 NOTE — Telephone Encounter (Signed)
 Called pt, prescription sent to [facility]

## 2024-11-24 NOTE — Telephone Encounter (Signed)
 Patient called stating that she never received her refill for sertraline  (ZOLOFT ) 50 MG tablet. She needs the RX

## 2024-12-02 ENCOUNTER — Ambulatory Visit: Payer: Self-pay | Admitting: *Deleted

## 2024-12-02 ENCOUNTER — Ambulatory Visit
Admission: EM | Admit: 2024-12-02 | Discharge: 2024-12-02 | Disposition: A | Discharge status: Home / Self Care | Attending: Emergency Medicine | Admitting: Emergency Medicine

## 2024-12-02 DIAGNOSIS — J0100 Acute maxillary sinusitis, unspecified: Secondary | ICD-10-CM

## 2024-12-02 DIAGNOSIS — J020 Streptococcal pharyngitis: Secondary | ICD-10-CM | POA: Diagnosis not present

## 2024-12-02 LAB — POCT RAPID STREP A (OFFICE): Rapid Strep A Screen: POSITIVE — AB

## 2024-12-02 MED ORDER — AMOXICILLIN 875 MG PO TABS: 875.0000 mg | ORAL_TABLET | Freq: Two times a day (BID) | ORAL | 0 refills | Status: AC

## 2024-12-02 NOTE — ED Provider Notes (Signed)
 [facility]    CSN: [id number] Arrival date & time: 12/02/24  1042      History   Chief Complaint Chief Complaint  Patient presents with   Cough   Sore Throat    HPI Mia Burnett is a 68 y.o. female.  Patient presents with 1 week history of congestion, postnasal drip, sinus pressure, sore throat, cough.  Treatment attempted with OTC cold medication without relief.  No fever, chest pain, shortness of breath, vomiting, diarrhea.  Negative COVID test at home.  The history is provided by the patient and medical records.    Past Medical History:  Diagnosis Date   Allergies    Gallbladder problem    Hyperlipidemia    Prediabetes     Patient Active Problem List   Diagnosis Date Noted   Long-term current use of proton pump inhibitor therapy 08/13/2024   Primary osteoarthritis of both hands 08/13/2024   Mixed hyperlipidemia 08/13/2024   Body aches 05/14/2024   Prediabetes 11/03/2023   Vitamin D  deficiency 11/03/2023   Generalized obesity- Start BMI 39.6 11/03/2023   BMI 36.0-36.9,adult Current BMI 36.4 11/03/2023   Class 2 obesity due to excess calories without serious comorbidity with body mass index (BMI) of 37.0 to 37.9 in adult 07/29/2023   GERD without esophagitis 01/28/2023   Hot flashes due to menopause 07/30/2022   Other hyperlipidemia 07/26/2022   Sleep disorder 07/26/2022   Insomnia 01/31/2022   Systolic murmur 01/30/2022   Trochanteric bursitis of left hip 07/26/2019   Plantar fasciitis 01/29/2016   Allergic rhinitis 10/10/2008    Past Surgical History:  Procedure Laterality Date   CHOLECYSTECTOMY  1993   REDUCTION MAMMAPLASTY Bilateral    2003   ROTATOR CUFF REPAIR Right    2016   SHOULDER SURGERY Left    2005    OB History     Gravida  1   Para      Term      Preterm      AB      Living         SAB      IAB      Ectopic      Multiple      Live Births               Home Medications    Prior to Admission  medications   Medication Sig Start Date End Date Taking? Authorizing Provider  amoxicillin  (AMOXIL ) 875 MG tablet Take 1 tablet (875 mg total) by mouth 2 (two) times daily for 10 days. 12/02/24 12/12/24 Yes Corlis Burnard DEL, NP  albuterol (VENTOLIN HFA) 108 (90 Base) MCG/ACT inhaler Inhale into the lungs. 06/10/22   [provider]  Calcium  Citrate (CITRACAL PO) Take 800 mg by mouth.    [provider]  Cholecalciferol (VITAMIN D3) 50 MCG (2000 UT) TABS Take by mouth.    [provider]  Coenzyme Q10 (COQ10) 200 MG CAPS Take by mouth.    [provider]  EPINEPHrine 0.3 mg/0.3 mL IJ SOAJ injection AUTO-INJECTOR INTO OUTER THIGH INJECTION AS NEEDED FOR ANAPHYLAXIS. 09/24/21   [provider]  fluticasone OREN ALLERGY RELIEF) 50 MCG/ACT nasal spray 2 sprays 12/04/10   [provider]  meloxicam  (MOBIC ) 7.5 MG tablet TAKE 1 TABLET EVERY DAY 11/11/24   Dugal, Tabitha, FNP  metFORMIN  (GLUCOPHAGE ) 500 MG tablet Take 1 tablet (500 mg total) by mouth daily. Start with 1/2 tablet daily and increase if tolerated after  1 week. 09/01/24 11/15/24  Rayburn, Elouise Phlegm, PA-C  montelukast (SINGULAIR) 10 MG tablet  06/15/15   [provider]  Multiple Vitamin (MULTIVITAMIN) capsule Take 1 capsule by mouth daily.    [provider]  omeprazole  (PRILOSEC) 20 MG capsule Take 1 capsule (20 mg total) by mouth daily. 08/13/24   Corwin Antu, FNP  PRESCRIPTION MEDICATION Allergy injections    [provider]  rosuvastatin  (CRESTOR ) 10 MG tablet TAKE 1 TABLET AT BEDTIME 04/12/24   Corwin Antu, FNP  sertraline  (ZOLOFT ) 50 MG tablet Take 1 tablet (50 mg total) by mouth daily. 11/24/24   Rayburn, Elouise Phlegm, PA-C  topiramate  (TOPAMAX ) 25 MG tablet Take 2 tablets (50 mg total) by mouth daily. 11/15/24   Rayburn, Elouise Phlegm, PA-C  zolpidem (AMBIEN) 10 MG tablet  07/03/15   [provider]    Family History Family History   Problem Relation Age of Onset   Cancer Mother    Breast cancer Mother 58   Obesity Mother    Early death Maternal Grandmother    Early death Maternal Grandfather    Healthy Half-Brother    Breast cancer Half-Sister    Breast cancer Half-Sister    Breast cancer Half-Sister     Social History Social History   Tobacco Use   Smoking status: Never   Smokeless tobacco: Never  Vaping Use   Vaping status: Never Used  Substance Use Topics   Alcohol use: Yes    Comment: 1 glass of wine every 2-3 weeks   Drug use: Never     Allergies   Sulfonamide derivatives, Sulfur, and Clarithromycin   Review of Systems Review of Systems  Constitutional:  Negative for chills and fever.  HENT:  Positive for congestion, postnasal drip, rhinorrhea, sinus pressure and sore throat. Negative for ear pain.   Respiratory:  Positive for cough. Negative for shortness of breath.   Cardiovascular:  Negative for chest pain and palpitations.  Gastrointestinal:  Negative for diarrhea and vomiting.     Physical Exam Triage Vital Signs ED Triage Vitals  Encounter Vitals Group     BP 12/02/24 1104 123/80     Girls Systolic BP Percentile --      Girls Diastolic BP Percentile --      Boys Systolic BP Percentile --      Boys Diastolic BP Percentile --      Pulse Rate 12/02/24 1104 81     Resp 12/02/24 1104 18     Temp 12/02/24 1104 (!) 97.5 F (36.4 C)     Temp src --      SpO2 12/02/24 1104 98 %     Weight --      Height --      Head Circumference --      Peak Flow --      Pain Score 12/02/24 1103 6     Pain Loc --      Pain Education --      Exclude from Growth Chart --    No data found.  Updated Vital Signs BP 123/80   Pulse 81   Temp (!) 97.5 F (36.4 C)   Resp 18   SpO2 98%   Visual Acuity Right Eye Distance:   Left Eye Distance:   Bilateral Distance:    Right Eye Near:   Left Eye Near:    Bilateral Near:     Physical Exam Constitutional:      General: She is not in  acute  distress. HENT:     Right Ear: Tympanic membrane normal.     Left Ear: Tympanic membrane normal.     Nose: Congestion present.     Mouth/Throat:     Mouth: Mucous membranes are moist.     Pharynx: Posterior oropharyngeal erythema present.  Cardiovascular:     Rate and Rhythm: Normal rate and regular rhythm.     Heart sounds: Normal heart sounds.  Pulmonary:     Effort: Pulmonary effort is normal. No respiratory distress.     Breath sounds: Normal breath sounds.  Neurological:     Mental Status: She is alert.      UC Treatments / Results  Labs (all labs ordered are listed, but only abnormal results are displayed) Labs Reviewed  POCT RAPID STREP A (OFFICE) - Abnormal; Notable for the following components:      Result Value   Rapid Strep A Screen Positive (*)    All other components within normal limits    EKG   Radiology No results found.  Procedures Procedures (including critical care time)  Medications Ordered in UC Medications - No data to display  Initial Impression / Assessment and Plan / UC Course  I have reviewed the triage vital signs and the nursing notes.  Pertinent labs & imaging results that were available during my care of the patient were reviewed by me and considered in my medical decision making (see chart for details).    Strep pharyngitis, acute sinusitis.  Afebrile and vital signs are stable.  Rapid strep positive.  Treating strep throat and sinus infection with amoxicillin .  Tylenol or ibuprofen as needed.  Plain Mucinex  as needed.  Instructed patient to follow-up with her PCP.  Education provided on strep throat and sinus infection.  She agrees to plan of care.  Final Clinical Impressions(s) / UC Diagnoses   Final diagnoses:  Strep pharyngitis  Acute non-recurrent maxillary sinusitis     Discharge Instructions      Your strep test is positive.    Take the amoxicillin  as directed for strep throat and a sinus infection.     Follow-up with your primary care provider.     ED Prescriptions     Medication Sig Dispense Auth. Provider   amoxicillin  (AMOXIL ) 875 MG tablet Take 1 tablet (875 mg total) by mouth 2 (two) times daily for 10 days. 20 tablet Corlis Burnard DEL, NP      PDMP not reviewed this encounter.   Corlis Burnard DEL, NP 12/02/24 1131

## 2024-12-02 NOTE — ED Triage Notes (Addendum)
 Patient to Urgent Care with complaints of  dry cough/ sore throat. Denies any known fevers.   Symptoms x1 week. Negative home covid test.   Taking advil and tylenol/ nyquil.

## 2024-12-02 NOTE — Discharge Instructions (Addendum)
 Your strep test is positive.    Take the amoxicillin  as directed for strep throat and a sinus infection.    Follow-up with your primary care provider.

## 2024-12-02 NOTE — Telephone Encounter (Signed)
 FYI Only or Action Required?: FYI only for provider: UC advised .  Patient was last seen in primary care on 11/15/2024 by Rayburn, Elouise Phlegm, PA-C.  Called Nurse Triage reporting Sore Throat.  Symptoms began a week ago.  Interventions attempted: OTC medications: Nyquil honey.  Symptoms are: gradually worsening.  Triage Disposition: See Physician Within 24 Hours  Patient/caregiver understands and will follow disposition?: yes  Copied from CRM #[id number]. Topic: Clinical - Red Word Triage >> Dec 02, 2024  8:27 AM Donna BRAVO wrote: Red Word that prompted transfer to Nurse Triage:  going on for 1 week -sore throat -painful to swallow -cough -teeth are sensitive  -cheek bones Reason for Disposition  SEVERE throat pain (e.g., excruciating)  Answer Assessment - Initial Assessment Questions 1. ONSET: When did the throat start hurting? (Hours or days ago)      1 week 2. SEVERITY: How bad is the sore throat? (Scale 1-10; mild, moderate or severe)     severe 3. STREP EXPOSURE: Has there been any exposure to strep within the past week? If Yes, ask: What type of contact occurred?      None known 4.  VIRAL SYMPTOMS: Are there any symptoms of a cold, such as a runny nose, cough, hoarse voice or red eyes?      Cough, sore cheek bones, teeth sensitive 5. FEVER: Do you have a fever? If Yes, ask: What is your temperature, how was it measured, and when did it start?     no 6. PUS ON THE TONSILS: Is there pus on the tonsils in the back of your throat?     No- feels raw 7. OTHER SYMPTOMS: Do you have any other symptoms? (e.g., difficulty breathing, headache, rash)     no  Protocols used: Sore Throat-A-AH

## 2024-12-24 ENCOUNTER — Ambulatory Visit

## 2024-12-24 VITALS — Ht 64.0 in | Wt 198.0 lb

## 2024-12-24 DIAGNOSIS — K219 Gastro-esophageal reflux disease without esophagitis: Secondary | ICD-10-CM | POA: Diagnosis not present

## 2024-12-24 DIAGNOSIS — Z0000 Encounter for general adult medical examination without abnormal findings: Secondary | ICD-10-CM | POA: Diagnosis not present

## 2024-12-24 NOTE — Patient Instructions (Signed)
 Ms. Friddle,  Thank you for taking the time for your Medicare Wellness Visit. I appreciate your continued commitment to your health goals. Please review the care plan we discussed, and feel free to reach out if I can assist you further.  Please note that Annual Wellness Visits do not include a physical exam. Some assessments may be limited, especially if the visit was conducted virtually. If needed, we may recommend an in-person follow-up with your provider.  Ongoing Care Seeing your primary care provider every 3 to 6 months helps us  monitor your health and provide consistent, personalized care.   Referrals If a referral was made during today's visit and you haven't received any updates within two weeks, please contact the referred provider directly to check on the status.  Recommended Screenings:  Health Maintenance  Topic Date Due   Medicare Annual Wellness Visit  Never done   Hepatitis C Screening  Never done   Zoster (Shingles) Vaccine (1 of 2) 09/03/1976   COVID-19 Vaccine (4 - 2025-26 season) 08/30/2024   Flu Shot  03/29/2025*   Colon Cancer Screening  07/24/2025   Breast Cancer Screening  07/22/2026   DTaP/Tdap/Td vaccine (5 - Td or Tdap) 09/29/2029   Pneumococcal Vaccine for age over 54  Completed   Osteoporosis screening with Bone Density Scan  Completed   Meningitis B Vaccine  Aged Out  *Topic was postponed. The date shown is not the original due date.       12/24/2024    9:15 AM  Advanced Directives  Does Patient Have a Medical Advance Directive? Yes  Type of Estate Agent of [place];Living will  Does patient want to make changes to medical advance directive? No - Patient declined  Copy of Healthcare Power of Attorney in Chart? No - copy requested    Vision: Annual vision screenings are recommended for early detection of glaucoma, cataracts, and diabetic retinopathy. These exams can also reveal signs of chronic conditions such as diabetes and  high blood pressure.  Dental: Annual dental screenings help detect early signs of oral cancer, gum disease, and other conditions linked to overall health, including heart disease and diabetes.

## 2024-12-24 NOTE — Progress Notes (Signed)
 "  Chief Complaint  Patient presents with   Medicare Wellness     Subjective:   Mia Burnett is a 68 y.o. female who presents for a Medicare Annual Wellness Visit.  Visit info / Clinical Intake: Medicare Wellness Visit Type:: Initial Annual Wellness Visit Persons participating in visit and providing information:: patient Medicare Wellness Visit Mode:: Telephone If telephone:: video declined Since this visit was completed virtually, some vitals may be partially provided or unavailable. Missing vitals are due to the limitations of the virtual format.: Unable to obtain vitals - no equipment If Telephone or Video please confirm:: I connected with patient using audio/video enable telemedicine. I verified patient identity with two identifiers, discussed telehealth limitations, and patient agreed to proceed. Patient Location:: home Provider Location:: home office Interpreter Needed?: No Pre-visit prep was completed: yes AWV questionnaire completed by patient prior to visit?: yes Patient's Overall Health Status Rating: very good Typical amount of pain: some Does pain affect daily life?: no Are you currently prescribed opioids?: no  Dietary Habits and Nutritional Risks How many meals a day?: 3 Eats fruit and vegetables daily?: yes Most meals are obtained by: preparing own meals; eating out In the last 2 weeks, have you had any of the following?: none Diabetic:: no  Functional Status Activities of Daily Living (to include ambulation/medication): Independent Ambulation: Independent Medication Administration: Independent Home Management (perform basic housework or laundry): Independent Manage your own finances?: yes Primary transportation is: driving Concerns about vision?: no *vision screening is required for WTM* Concerns about hearing?: no  Fall Screening Falls in the past year?: 0 Number of falls in past year: 0 Was there an injury with Fall?: 0 Fall Risk Category  Calculator: 0 Patient Fall Risk Level: Low Fall Risk  Fall Risk Patient at Risk for Falls Due to: No Fall Risks Fall risk Follow up: Falls evaluation completed; Education provided; Falls prevention discussed; Follow up appointment  Home and Transportation Safety: All rugs have non-skid backing?: N/A, no rugs All stairs or steps have railings?: yes Grab bars in the bathtub or shower?: yes Have non-skid surface in bathtub or shower?: yes Good home lighting?: yes Regular seat belt use?: yes Hospital stays in the last year:: no  Cognitive Assessment Difficulty concentrating, remembering, or making decisions? : no Will 6CIT or Mini Cog be Completed: yes Give patient an address phrase to remember (5 components): [address] [place]   Advance Directives (For Healthcare) Does Patient Have a Medical Advance Directive?: Yes Does patient want to make changes to medical advance directive?: No - Patient declined Type of Advance Directive: Healthcare Power of [place]; Living will Copy of Healthcare Power of Attorney in Chart?: No - copy requested Copy of Living Will in Chart?: No - copy requested  Reviewed/Updated  Reviewed/Updated: Reviewed All (Medical, Surgical, Family, Medications, Allergies, Care Teams, Patient Goals)    Allergies (verified) Sulfonamide derivatives, Sulfur, and Clarithromycin   Current Medications (verified) Outpatient Encounter Medications as of 12/24/2024  Medication Sig   albuterol (VENTOLIN HFA) 108 (90 Base) MCG/ACT inhaler Inhale into the lungs.   Calcium  Citrate (CITRACAL PO) Take 800 mg by mouth.   Cholecalciferol (VITAMIN D3) 50 MCG (2000 UT) TABS Take by mouth.   Coenzyme Q10 (COQ10) 200 MG CAPS Take by mouth.   EPINEPHrine 0.3 mg/0.3 mL IJ SOAJ injection AUTO-INJECTOR INTO OUTER THIGH INJECTION AS NEEDED FOR ANAPHYLAXIS.   fluticasone (FLONASE ALLERGY RELIEF) 50 MCG/ACT nasal spray 2 sprays   meloxicam  (MOBIC ) 7.5 MG tablet TAKE 1  TABLET  EVERY DAY   montelukast (SINGULAIR) 10 MG tablet    Multiple Vitamin (MULTIVITAMIN) capsule Take 1 capsule by mouth daily.   omeprazole  (PRILOSEC) 20 MG capsule Take 1 capsule (20 mg total) by mouth daily.   PRESCRIPTION MEDICATION Allergy injections   rosuvastatin  (CRESTOR ) 10 MG tablet TAKE 1 TABLET AT BEDTIME   sertraline  (ZOLOFT ) 50 MG tablet Take 1 tablet (50 mg total) by mouth daily.   topiramate  (TOPAMAX ) 25 MG tablet Take 2 tablets (50 mg total) by mouth daily.   zolpidem (AMBIEN) 10 MG tablet    metFORMIN  (GLUCOPHAGE ) 500 MG tablet Take 1 tablet (500 mg total) by mouth daily. Start with 1/2 tablet daily and increase if tolerated after 1 week. (Patient not taking: Reported on 12/24/2024)   No facility-administered encounter medications on file as of 12/24/2024.    History: Past Medical History:  Diagnosis Date   Allergies    Allergy    Gallbladder problem    GERD (gastroesophageal reflux disease)    Hyperlipidemia    Prediabetes    Past Surgical History:  Procedure Laterality Date   CESAREAN SECTION  1991   CHOLECYSTECTOMY  1993   REDUCTION MAMMAPLASTY Bilateral    2003   ROTATOR CUFF REPAIR Right    2016   SHOULDER SURGERY Left    2005   Family History  Problem Relation Age of Onset   Cancer Mother    Breast cancer Mother 93   Obesity Mother    Early death Maternal Grandmother    Early death Maternal Grandfather    Healthy Half-Brother    Breast cancer Half-Sister    Breast cancer Half-Sister    Breast cancer Half-Sister    Cancer Sister    Cancer Sister    Cancer Sister    Social History   Occupational History    Employer: [employer]    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 [employer]  Tobacco Use   Smoking status: Never   Smokeless tobacco: Never  Vaping Use   Vaping status: Never Used  Substance and Sexual Activity   Alcohol use: Yes    Comment: 1 glass of wine every 2-3 weeks   Drug use: Never   Sexual activity: Not Currently    Partners:  Male   Tobacco Counseling Counseling given: Not Answered  SDOH Screenings   Food Insecurity: No Food Insecurity (12/24/2024)  Housing: Low Risk (12/24/2024)  Transportation Needs: No Transportation Needs (12/24/2024)  Utilities: Not At Risk (12/24/2024)  Alcohol Screen: Low Risk (08/09/2024)  Depression (PHQ2-9): Low Risk (12/24/2024)  Financial Resource Strain: Low Risk (08/09/2024)  Physical Activity: Insufficiently Active (12/24/2024)  Social Connections: Moderately Isolated (12/24/2024)  Stress: No Stress Concern Present (12/24/2024)  Tobacco Use: Low Risk (12/24/2024)  Health Literacy: Adequate Health Literacy (12/24/2024)   See flowsheets for full screening details  Depression Screen PHQ 2 & 9 Depression Scale- Over the past 2 weeks, how often have you been bothered by any of the following problems? Little interest or pleasure in doing things: 0 Feeling down, depressed, or hopeless (PHQ Adolescent also includes...irritable): 0 PHQ-2 Total Score: 0 Trouble falling or staying asleep, or sleeping too much: 0 Feeling tired or having little energy: 0 Poor appetite or overeating (PHQ Adolescent also includes...weight loss): 0 Feeling bad about yourself - or that you are a failure or have let yourself or your family down: 0 Trouble concentrating on things, such as reading the newspaper or watching television (PHQ Adolescent also includes...like school  work): 0 Moving or speaking so slowly that other people could have noticed. Or the opposite - being so fidgety or restless that you have been moving around a lot more than usual: 0 Thoughts that you would be better off dead, or of hurting yourself in some way: 0 PHQ-9 Total Score: 0 If you checked off any problems, how difficult have these problems made it for you to do your work, take care of things at home, or get along with other people?: Not difficult at all     Goals Addressed             This Visit's Progress    I would  like to lose more weight and be more active.       Manage my blood sugars to avoid future problems               Objective:    Today's Vitals   12/24/24 0911  Weight: 198 lb (89.8 kg)  Height: 5' 4 (1.626 m)   Body mass index is 33.99 kg/m.  Hearing/Vision screen Vision Screening - Comments:: UTD w/Dr Camillo Immunizations and Health Maintenance Health Maintenance  Topic Date Due   Hepatitis C Screening  Never done   Zoster Vaccines- Shingrix (1 of 2) 09/03/1976   COVID-19 Vaccine (4 - 2025-26 season) 08/30/2024   Influenza Vaccine  03/29/2025 (Originally 07/30/2024)   Colonoscopy  07/24/2025   Medicare Annual Wellness (AWV)  12/24/2025   Mammogram  07/22/2026   DTaP/Tdap/Td (5 - Td or Tdap) 09/29/2029   Pneumococcal Vaccine: 50+ Years  Completed   Bone Density Scan  Completed   Meningococcal B Vaccine  Aged Out        Assessment/Plan:  This is a routine wellness examination for [place].  Patient Care Team: Corwin Antu, FNP as PCP - General (Family Medicine) Frutoso Luz as Consulting Physician (Allergy) Camillo Golas, MD as Attending Physician (Ophthalmology) Rayburn, Elouise Phlegm, PA-C as Physician Assistant (Family Medicine) Leva Rush, MD as Consulting Physician (Obstetrics and Gynecology)  I have personally reviewed and noted the following in the patients chart:   Medical and social history Use of alcohol, tobacco or illicit drugs  Current medications and supplements including opioid prescriptions. Functional ability and status Nutritional status Physical activity Advanced directives List of other physicians Hospitalizations, surgeries, and ER visits in previous 12 months Vitals Screenings to include cognitive, depression, and falls Referrals and appointments  No orders of the defined types were placed in this encounter.  In addition, I have reviewed and discussed with patient certain preventive protocols, quality metrics, and best practice  recommendations. A written personalized care plan for preventive services as well as general preventive health recommendations were provided to patient.   Erminio LITTIE Saris, LPN   [id number]   Return in 1 year (on 12/24/2025) for annual wellness.  After Visit Summary: (MyChart) Due to this being a telephonic visit, the after visit summary with patients personalized plan was offered to patient via MyChart   Nurse Notes: No voiced or noted concerns at this time Patient advised to keep follow-up appointment with PCP (02/18/25) HM Addressed: pt says she has had Shingles (chart records); will obtain Covid vaccine from pharmacy if decides to obtain  "

## 2025-01-05 ENCOUNTER — Ambulatory Visit (INDEPENDENT_AMBULATORY_CARE_PROVIDER_SITE_OTHER): Admitting: Physician Assistant

## 2025-01-26 ENCOUNTER — Ambulatory Visit (INDEPENDENT_AMBULATORY_CARE_PROVIDER_SITE_OTHER): Admitting: Adult Health

## 2025-01-29 ENCOUNTER — Other Ambulatory Visit: Payer: Self-pay | Admitting: Family

## 2025-01-29 DIAGNOSIS — E782 Mixed hyperlipidemia: Secondary | ICD-10-CM

## 2025-02-14 ENCOUNTER — Ambulatory Visit (INDEPENDENT_AMBULATORY_CARE_PROVIDER_SITE_OTHER): Admitting: Physician Assistant

## 2025-02-18 ENCOUNTER — Ambulatory Visit: Admitting: Family

## 2025-05-20 ENCOUNTER — Encounter: Admitting: Family
